# Patient Record
Sex: Female | Born: 1980 | Hispanic: Yes | Marital: Single | Smoking: Never smoker
Health system: Southern US, Community
[De-identification: ages and names within clinical notes are randomized; demographics above are authoritative.]

## PROBLEM LIST (undated history)

## (undated) DIAGNOSIS — K029 Dental caries, unspecified: Secondary | ICD-10-CM

## (undated) DIAGNOSIS — K649 Unspecified hemorrhoids: Secondary | ICD-10-CM

## (undated) DIAGNOSIS — Z789 Other specified health status: Secondary | ICD-10-CM

## (undated) HISTORY — DX: Dental caries, unspecified: K02.9

## (undated) HISTORY — PX: NO PAST SURGERIES: SHX2092

## (undated) HISTORY — DX: Unspecified hemorrhoids: K64.9

## (undated) HISTORY — DX: Other specified health status: Z78.9

---

## 2007-05-09 ENCOUNTER — Ambulatory Visit: Payer: Self-pay

## 2007-06-25 LAB — OB RESULTS CONSOLE RUBELLA ANTIBODY, IGM: Rubella: IMMUNE

## 2007-09-17 LAB — SICKLE CELL SCREEN: Sickle Cell Screen: NORMAL

## 2007-09-17 LAB — OB RESULTS CONSOLE VARICELLA ZOSTER ANTIBODY, IGG: Varicella: IMMUNE

## 2007-10-15 ENCOUNTER — Ambulatory Visit: Payer: Self-pay | Admitting: Family Medicine

## 2007-11-14 LAB — OB RESULTS CONSOLE TB SKIN TEST: CHL TB SkinTest: NEGATIVE

## 2007-12-31 ENCOUNTER — Inpatient Hospital Stay: Payer: Self-pay

## 2009-05-15 ENCOUNTER — Emergency Department: Payer: Self-pay | Admitting: Emergency Medicine

## 2009-11-11 ENCOUNTER — Inpatient Hospital Stay: Payer: Self-pay | Admitting: Obstetrics and Gynecology

## 2014-02-26 ENCOUNTER — Ambulatory Visit: Payer: Self-pay | Admitting: Physician Assistant

## 2014-03-12 ENCOUNTER — Ambulatory Visit: Payer: Self-pay | Admitting: Advanced Practice Midwife

## 2014-03-20 ENCOUNTER — Emergency Department: Payer: Self-pay | Admitting: Emergency Medicine

## 2014-03-20 LAB — CBC
HCT: 40.2 % (ref 35.0–47.0)
HGB: 13.6 g/dL (ref 12.0–16.0)
MCH: 31.5 pg (ref 26.0–34.0)
MCHC: 33.9 g/dL (ref 32.0–36.0)
MCV: 93 fL (ref 80–100)
PLATELETS: 201 10*3/uL (ref 150–440)
RBC: 4.33 10*6/uL (ref 3.80–5.20)
RDW: 13.1 % (ref 11.5–14.5)
WBC: 11.9 10*3/uL — AB (ref 3.6–11.0)

## 2014-03-20 LAB — WET PREP, GENITAL

## 2014-03-20 LAB — URINALYSIS, COMPLETE
Bilirubin,UR: NEGATIVE
Glucose,UR: NEGATIVE mg/dL (ref 0–75)
KETONE: NEGATIVE
Nitrite: NEGATIVE
PH: 7 (ref 4.5–8.0)
Protein: NEGATIVE
RBC,UR: 1421 /HPF (ref 0–5)
SPECIFIC GRAVITY: 1.017 (ref 1.003–1.030)
WBC UR: 7 /HPF (ref 0–5)

## 2014-03-20 LAB — HCG, QUANTITATIVE, PREGNANCY: BETA HCG, QUANT.: 6251 m[IU]/mL — AB

## 2014-03-20 LAB — GC/CHLAMYDIA PROBE AMP

## 2014-03-21 LAB — CBC WITH DIFFERENTIAL/PLATELET
Basophil #: 0 10*3/uL (ref 0.0–0.1)
Basophil %: 0.2 %
EOS PCT: 1 %
Eosinophil #: 0.2 10*3/uL (ref 0.0–0.7)
HCT: 42.5 % (ref 35.0–47.0)
HGB: 13.9 g/dL (ref 12.0–16.0)
LYMPHS PCT: 12.9 %
Lymphocyte #: 2.6 10*3/uL (ref 1.0–3.6)
MCH: 30.7 pg (ref 26.0–34.0)
MCHC: 32.6 g/dL (ref 32.0–36.0)
MCV: 94 fL (ref 80–100)
MONO ABS: 0.9 x10 3/mm (ref 0.2–0.9)
MONOS PCT: 4.2 %
Neutrophil #: 16.6 10*3/uL — ABNORMAL HIGH (ref 1.4–6.5)
Neutrophil %: 81.7 %
PLATELETS: 205 10*3/uL (ref 150–440)
RBC: 4.51 10*6/uL (ref 3.80–5.20)
RDW: 12.9 % (ref 11.5–14.5)
WBC: 20.3 10*3/uL — AB (ref 3.6–11.0)

## 2014-03-21 LAB — COMPREHENSIVE METABOLIC PANEL
ALBUMIN: 3.7 g/dL (ref 3.4–5.0)
ALK PHOS: 73 U/L
ALT: 26 U/L
ANION GAP: 4 — AB (ref 7–16)
BUN: 11 mg/dL (ref 7–18)
Bilirubin,Total: 0.2 mg/dL (ref 0.2–1.0)
CHLORIDE: 106 mmol/L (ref 98–107)
CO2: 26 mmol/L (ref 21–32)
Calcium, Total: 9.1 mg/dL (ref 8.5–10.1)
Creatinine: 0.74 mg/dL (ref 0.60–1.30)
EGFR (African American): >60
EGFR (Non-African Amer.): >60
GLUCOSE: 110 mg/dL — AB (ref 65–99)
Osmolality: 272 (ref 275–301)
Potassium: 3.4 mmol/L — ABNORMAL LOW (ref 3.5–5.1)
SGOT(AST): 19 U/L (ref 15–37)
Sodium: 136 mmol/L (ref 136–145)
Total Protein: 8.5 g/dL — ABNORMAL HIGH (ref 6.4–8.2)

## 2014-03-21 LAB — HCG, QUANTITATIVE, PREGNANCY: BETA HCG, QUANT.: 4732 m[IU]/mL — AB

## 2014-03-22 ENCOUNTER — Observation Stay: Payer: Self-pay | Admitting: Obstetrics and Gynecology

## 2014-03-22 LAB — URINALYSIS, COMPLETE
Bacteria: NONE SEEN
RBC,UR: 568 /HPF (ref 0–5)
SPECIFIC GRAVITY: 1.01 (ref 1.003–1.030)
Squamous Epithelial: NONE SEEN
WBC UR: 1 /HPF (ref 0–5)

## 2014-03-22 LAB — CBC WITH DIFFERENTIAL/PLATELET
Basophil #: 0 10*3/uL (ref 0.0–0.1)
Basophil %: 0.2 %
EOS PCT: 0.5 %
Eosinophil #: 0.1 10*3/uL (ref 0.0–0.7)
HCT: 29.6 % — ABNORMAL LOW (ref 35.0–47.0)
HGB: 10.1 g/dL — ABNORMAL LOW (ref 12.0–16.0)
LYMPHS ABS: 2.2 10*3/uL (ref 1.0–3.6)
LYMPHS PCT: 16.1 %
MCH: 31.5 pg (ref 26.0–34.0)
MCHC: 34 g/dL (ref 32.0–36.0)
MCV: 93 fL (ref 80–100)
Monocyte #: 0.6 x10 3/mm (ref 0.2–0.9)
Monocyte %: 4.1 %
Neutrophil #: 10.9 10*3/uL — ABNORMAL HIGH (ref 1.4–6.5)
Neutrophil %: 79.1 %
Platelet: 153 10*3/uL (ref 150–440)
RBC: 3.2 10*6/uL — AB (ref 3.80–5.20)
RDW: 12.7 % (ref 11.5–14.5)
WBC: 13.7 10*3/uL — AB (ref 3.6–11.0)

## 2014-03-23 LAB — PATHOLOGY REPORT

## 2014-08-20 NOTE — L&D Delivery Note (Signed)
VAGINAL DELIVERY NOTE:  Date of Delivery: 07/22/2015 Primary OB: [facility]  Gestational Age/EDD: 3442w0d 07/29/2015, by Ultrasound Antepartum complications: none Attending Physician: Hal NeerJones, CNM  Delivery Type: spontaneous vaginal delivery  Anesthesia: none Laceration: none Episiotomy: none Placenta: spontaneous Intrapartum complications: Variable decels prior to delivery  Due to CAN x 1 reduced Estimated Blood Loss: 400 ml GBS: neg Procedure Details: NSVD of viable female in straight OA pos, CAN x 1 loose reduced, Ant and post shoulder and body del at     . To mom;s infant with strong cry. CCx2 and cut per dad. Cord blood sent. SDOP intact with intact placenta. FF and lochia mod.   Baby: Liveborn female, Apgars 8-9, weight  #,  oz,    Natasha PimpleCaron [name] 07/22/2015, 11:00 PM

## 2014-12-11 NOTE — Op Note (Signed)
PATIENT NAME:  Natasha Mckee, Natasha Mckee MR#:  [id number] DATE OF BIRTH:  01/11/1981  DATE OF PROCEDURE:  03/22/2014  PREOPERATIVE DIAGNOSIS: Missed abortion.  POSTOPERATIVE DIAGNOSIS:  Missed abortion.  PROCEDURE PERFORMED: Suction curette dilation and curettage.   SURGEON: Elliot Gurneyarrie C. Tayjah Lobdell, MD  ESTIMATED BLOOD LOSS: 100 mL.   FINDINGS: Tissue most likely products of conception.   DESCRIPTION OF PROCEDURE: The patient was taken to the operating room and placed in supine position. After adequate general endotracheal anesthesia was instilled, the patient was prepped and draped in the usual sterile fashion. A side-opening speculum was placed in the patient's vagina. The anterior lip of the cervix was grasped with a single-tooth tenaculum. The uterus was sounded and dilated to accommodate a size 8 curved suction curette. Curettage was performed then suction was performed and then a final curette. Products of conception were identified. The uterus was massaged and found to be hemostatic. The patient was then laid supine and taken to recovery after having tolerated the procedure well and after the speculum was removed.   ____________________________ Elliot Gurneyarrie C. Mosie Angus, MD cck:ST D: 04/06/2014 23:43:14 ET T: 04/07/2014 00:21:37 ET JOB#: [id number]  cc: Elliot Gurneyarrie C. Leavy Heatherly, MD, <Dictator> Elliot GurneyARRIE C Sephora Boyar MD ELECTRONICALLY SIGNED 04/10/2014 17:39

## 2014-12-11 NOTE — Consult Note (Signed)
No Known Allergies:    General Aspect miscarriage   Present Illness 33yo G8 who presents with bleeding and an elevatted WBC, she was seen yesterday in the ER and was given cytotec and given an rtx for abx and cytotec. her wbc has gone up and the second dose was not taken. unsure if she took abx or not. she says she did but npt likely wityh the elevation of the WBC. she presnts today with cramping and bleeidng and a drop in beta from 6200 to 4700 now not bleeding. but cramoping severely   Case History and Physical Exam:  Chief Complaint Abdominal Pain  vaginal bleeding   Past Surgical History None   Primary Care Provider [facility]   Family History Non-Contributory   HEENT PERLA   Neck/Nodes Supple   Chest/Lungs Clear   Breasts Not examined   Cardiovascular Normal Sinus Rhythm   Abdomen Active bowel sounds  tendert o palpation over the uterus   Genitalia WNL   Rectal Not examined   Musculoskeletal Full range of motion   Neurological Grossly WNL   Skin Warm    Impression incomplete ab with elevation of WBC   Plan 1) start antibiotics and do d and c keep on iv abx until wbc normal clindamycin and ceftriaxone q 12 hours. 2) admit to obs   Electronic Signatures: Adria DevonKlett, Dot Splinter (MD)  (Signed 03-Aug-15 00:35)  Authored: Allergies, General Aspect/Present Illness, History and Physical Exam, Impression/Plan   Last Updated: 03-Aug-15 00:35 by Adria DevonKlett, Shantanu Strauch (MD)

## 2015-07-22 ENCOUNTER — Inpatient Hospital Stay
Admission: EM | Admit: 2015-07-22 | Discharge: 2015-07-24 | DRG: 775 | Disposition: A | Discharge status: Home / Self Care | Payer: Medicaid Other | Attending: Obstetrics and Gynecology | Admitting: Obstetrics and Gynecology

## 2015-07-22 DIAGNOSIS — O99214 Obesity complicating childbirth: Principal | ICD-10-CM | POA: Diagnosis present

## 2015-07-22 DIAGNOSIS — K029 Dental caries, unspecified: Secondary | ICD-10-CM | POA: Diagnosis present

## 2015-07-22 DIAGNOSIS — Z3A39 39 weeks gestation of pregnancy: Secondary | ICD-10-CM | POA: Diagnosis not present

## 2015-07-22 LAB — CBC WITH DIFFERENTIAL/PLATELET
BASOS PCT: 0 %
Basophils Absolute: 0 10*3/uL (ref 0–0.1)
EOS ABS: 0.1 10*3/uL (ref 0–0.7)
EOS PCT: 1 %
HCT: 39.6 % (ref 35.0–47.0)
Hemoglobin: 13.2 g/dL (ref 12.0–16.0)
LYMPHS ABS: 2.1 10*3/uL (ref 1.0–3.6)
Lymphocytes Relative: 23 %
MCH: 30.9 pg (ref 26.0–34.0)
MCHC: 33.4 g/dL (ref 32.0–36.0)
MCV: 92.5 fL (ref 80.0–100.0)
Monocytes Absolute: 0.6 10*3/uL (ref 0.2–0.9)
Monocytes Relative: 7 %
Neutro Abs: 6.6 10*3/uL — ABNORMAL HIGH (ref 1.4–6.5)
Neutrophils Relative %: 69 %
PLATELETS: 210 10*3/uL (ref 150–440)
RBC: 4.28 MIL/uL (ref 3.80–5.20)
RDW: 13.5 % (ref 11.5–14.5)
WBC: 9.5 10*3/uL (ref 3.6–11.0)

## 2015-07-22 LAB — ABO/RH: ABO/RH(D): B POS

## 2015-07-22 LAB — TYPE AND SCREEN
ABO/RH(D): B POS
ANTIBODY SCREEN: NEGATIVE

## 2015-07-22 MED ORDER — LACTATED RINGERS IV SOLN
INTRAVENOUS | Status: DC
  Administered 2015-07-22: 19:00:00 via INTRAVENOUS

## 2015-07-22 MED ORDER — ACETAMINOPHEN 325 MG PO TABS
650.0000 mg | ORAL_TABLET | ORAL | Status: DC | PRN
  Administered 2015-07-23: 650 mg via ORAL
  Filled 2015-07-22: qty 2

## 2015-07-22 MED ORDER — ONDANSETRON HCL 4 MG/2ML IJ SOLN: 4.0000 mg | Freq: Four times a day (QID) | INTRAMUSCULAR | Status: DC | PRN

## 2015-07-22 MED ORDER — OXYTOCIN 40 UNITS IN LACTATED RINGERS INFUSION - SIMPLE MED
62.5000 mL/h | INTRAVENOUS | Status: DC
  Filled 2015-07-22: qty 1000

## 2015-07-22 MED ORDER — BUTORPHANOL TARTRATE 1 MG/ML IJ SOLN
1.0000 mg | INTRAMUSCULAR | Status: DC | PRN
  Administered 2015-07-22: 1 mg via INTRAVENOUS

## 2015-07-22 MED ORDER — OXYTOCIN BOLUS FROM INFUSION
500.0000 mL | INTRAVENOUS | Status: DC
  Administered 2015-07-22: 500 mL via INTRAVENOUS

## 2015-07-22 MED ORDER — LIDOCAINE HCL (PF) 1 % IJ SOLN
30.0000 mL | INTRAMUSCULAR | Status: DC | PRN
  Filled 2015-07-22: qty 30

## 2015-07-22 MED ORDER — BUTORPHANOL TARTRATE 1 MG/ML IJ SOLN
1.0000 mg | INTRAMUSCULAR | Status: DC | PRN
  Filled 2015-07-22: qty 1

## 2015-07-22 MED ORDER — CITRIC ACID-SODIUM CITRATE 334-500 MG/5ML PO SOLN: 30.0000 mL | ORAL | Status: DC | PRN

## 2015-07-22 MED ORDER — LACTATED RINGERS IV SOLN
500.0000 mL | INTRAVENOUS | Status: DC | PRN
Start: 2015-07-22 — End: 2015-07-23

## 2015-07-22 NOTE — Progress Notes (Signed)
S: Very uncomfortable without epidural. + CTX, + LOF, no VB O: Filed Vitals:   07/22/15 1815 07/22/15 1818  BP:  136/85  Pulse:  92  Temp:  99.3 F (37.4 C)  TempSrc:  Oral  Resp:  18  Height: 5\' 8"  (1.727 m)   Weight: 182 lb (82.555 kg)      Gen: NAD, AAOx3      Abd: FNTTP      Ext: Non-tender, Nonedmeatous    FHT:+mod var + accelerations, occas variable decelerations TOCO: Q 2-2.5 min SVE: 4/100/vtx-2   A/P:  34 y.o. yo [id number] at 7037w0d for SROM for clear  Fluid this pm   Labor: progressing  FWB: Reassuring Cat 1 tracing. EFW 7#15 oz  GBS:neg Waiting for CBC to do epidural.    Sharee Pimplearon W Joeleen Wortley 9:50 PM

## 2015-07-22 NOTE — H&P (Signed)
HISTORY AND PHYSICAL  HISTORY OF PRESENT ILLNESS: Natasha Mckee is a 34 y.o. [id number] at 256w0d by LMP consistent with  week ultrasound with a pregnancy complicated by  presenting for induction of labor. PNC significant for grand multiparity, AMA, 1 prev Pre-term at 5136 6/7 weeks, dental caries, Obesity. 1 h GCT abnormal with normal 3 h GCT.   She has  been having  leakage of fluid since 4pm today,  No vaginal bleeding, or decreased fetal movement.    REVIEW OF SYSTEMS: A complete review of systems was performed and was specifically negative for headache, changes in vision, RUQ pain, shortness of breath, chest pain, lower extremity edema and dysuria.   HISTORY:  History reviewed. No pertinent past medical history.  Past Surgical History  Procedure Laterality Date  . No past surgeries      No current facility-administered medications on file prior to encounter.   No current outpatient prescriptions on file prior to encounter.     No Known Allergies  OB History  Gravida Para Term Preterm AB SAB TAB Ectopic Multiple Living  9 6 5 1 2 2    6     # Outcome Date GA Lbr Len/2nd Weight Sex Delivery Anes PTL Lv  9 Current           8 SAB           7 SAB           6 Preterm           5 Term           4 Term           3 Term           2 Term           1 Term               Gynecologic History: GC/CH neg History of Abnormal Pap Smear: None found on chart History of STI: unknown   Social History  Substance Use Topics  . Smoking status: Never Smoker   . Smokeless tobacco: Never Used  . Alcohol Use: No    PHYSICAL EXAM: Temp:  [99.3 F (37.4 C)] 99.3 F (37.4 C) (12/02 1818) Pulse Rate:  [92] 92 (12/02 1818) Resp:  [18] 18 (12/02 1818) BP: (136)/(85) 136/85 mmHg (12/02 1818) Weight:  [182 lb (82.555 kg)] 182 lb (82.555 kg) (12/02 1815)  GENERAL: NAD AAOx3 CHEST:CTAB no increased work of breathing CV:RRR no appreciable murmurs, rubs, gallops ABDOMEN: gravid, nontender,  EFW 7#14 by [place] EXTREMITIES:  Warm and well-perfused, nontender, nonedematous, 1+ DTRs 0clonus CERVIX: 3/80/vtx SPECULUM: not done  FHT:s baseline with  Mod variability ,+ accelerations and  occas variable deceleration.   Toco: q 3-5 mins  DIAGNOSTIC STUDIES: None in pt chart  PRENATAL STUDIES:  Prenatal Labs:  MBT: B pos; Rubella immune, Varicella immune, HIV neg, RPR neg, Hep B neg, GC/CT neg, GBS neg , glucola  3 h WNL  [facility], normal anatomy, no % listed for growth  ASSESSMENT AND PLAN:  1. Fetal Well being  - Fetal Tracing: Cat I - Ultrasound:  reviewed, as above - Group B Streptococcus: neg - Presentation: Vtx  confirmed by CNM  2. Routine OB: - Prenatal labs reviewed, as above - Rh  B pos   3. Induction of Labor:  -  Contractions external toco in place -  Pelvis proven to ? (pt unsure of  biggest weight  -  Plan for delivery: Stadol and perhaps Epidural  4. Post Partum Planning: - Infant feeding: Bottle - Contraception: unsure

## 2015-07-23 LAB — CBC
HCT: 35.8 % (ref 35.0–47.0)
HEMOGLOBIN: 12.2 g/dL (ref 12.0–16.0)
MCH: 31.1 pg (ref 26.0–34.0)
MCHC: 34.1 g/dL (ref 32.0–36.0)
MCV: 91.2 fL (ref 80.0–100.0)
Platelets: 196 10*3/uL (ref 150–440)
RBC: 3.93 MIL/uL (ref 3.80–5.20)
RDW: 13.4 % (ref 11.5–14.5)
WBC: 18.3 10*3/uL — ABNORMAL HIGH (ref 3.6–11.0)

## 2015-07-23 MED ORDER — MEASLES, MUMPS & RUBELLA VAC ~~LOC~~ INJ: 0.5000 mL | INJECTION | Freq: Once | SUBCUTANEOUS | Status: DC

## 2015-07-23 MED ORDER — SODIUM CHLORIDE 0.9 % IJ SOLN: 3.0000 mL | Freq: Two times a day (BID) | INTRAMUSCULAR | Status: DC

## 2015-07-23 MED ORDER — FLEET ENEMA 7-19 GM/118ML RE ENEM: 1.0000 | ENEMA | Freq: Every day | RECTAL | Status: DC | PRN

## 2015-07-23 MED ORDER — TETANUS-DIPHTH-ACELL PERTUSSIS 5-2.5-18.5 LF-MCG/0.5 IM SUSP: 0.5000 mL | Freq: Once | INTRAMUSCULAR | Status: DC

## 2015-07-23 MED ORDER — SIMETHICONE 80 MG PO CHEW: 80.0000 mg | CHEWABLE_TABLET | ORAL | Status: DC | PRN

## 2015-07-23 MED ORDER — SENNOSIDES-DOCUSATE SODIUM 8.6-50 MG PO TABS: 2.0000 | ORAL_TABLET | ORAL | Status: DC

## 2015-07-23 MED ORDER — ZOLPIDEM TARTRATE 5 MG PO TABS: 5.0000 mg | ORAL_TABLET | Freq: Every evening | ORAL | Status: DC | PRN

## 2015-07-23 MED ORDER — OXYCODONE-ACETAMINOPHEN 5-325 MG PO TABS: 1.0000 | ORAL_TABLET | ORAL | Status: DC | PRN

## 2015-07-23 MED ORDER — ONDANSETRON HCL 4 MG/2ML IJ SOLN: 4.0000 mg | INTRAMUSCULAR | Status: DC | PRN

## 2015-07-23 MED ORDER — OXYCODONE-ACETAMINOPHEN 5-325 MG PO TABS: 2.0000 | ORAL_TABLET | ORAL | Status: DC | PRN

## 2015-07-23 MED ORDER — ONDANSETRON HCL 4 MG PO TABS: 4.0000 mg | ORAL_TABLET | ORAL | Status: DC | PRN

## 2015-07-23 MED ORDER — BISACODYL 10 MG RE SUPP: 10.0000 mg | Freq: Every day | RECTAL | Status: DC | PRN

## 2015-07-23 MED ORDER — IBUPROFEN 600 MG PO TABS
600.0000 mg | ORAL_TABLET | Freq: Four times a day (QID) | ORAL | Status: DC
  Administered 2015-07-23 – 2015-07-24 (×4): 600 mg via ORAL
  Filled 2015-07-23 (×4): qty 1

## 2015-07-23 MED ORDER — DIPHENHYDRAMINE HCL 25 MG PO CAPS: 25.0000 mg | ORAL_CAPSULE | Freq: Four times a day (QID) | ORAL | Status: DC | PRN

## 2015-07-23 MED ORDER — OXYTOCIN 40 UNITS IN LACTATED RINGERS INFUSION - SIMPLE MED
62.5000 mL/h | INTRAVENOUS | Status: DC | PRN
  Filled 2015-07-23: qty 1000

## 2015-07-23 MED ORDER — SODIUM CHLORIDE 0.9 % IJ SOLN: 3.0000 mL | INTRAMUSCULAR | Status: DC | PRN

## 2015-07-23 MED ORDER — WITCH HAZEL-GLYCERIN EX PADS: 1.0000 "application " | MEDICATED_PAD | CUTANEOUS | Status: DC | PRN

## 2015-07-23 MED ORDER — PRENATAL MULTIVITAMIN CH
1.0000 | ORAL_TABLET | Freq: Every day | ORAL | Status: DC
  Administered 2015-07-23 – 2015-07-24 (×2): 1 via ORAL
  Filled 2015-07-23 (×2): qty 1

## 2015-07-23 MED ORDER — SODIUM CHLORIDE 0.9 % IV SOLN: 250.0000 mL | INTRAVENOUS | Status: DC | PRN

## 2015-07-23 MED ORDER — BENZOCAINE-MENTHOL 20-0.5 % EX AERO
1.0000 "application " | INHALATION_SPRAY | CUTANEOUS | Status: DC | PRN
  Administered 2015-07-23: 1 via TOPICAL
  Filled 2015-07-23: qty 56

## 2015-07-23 MED ORDER — ACETAMINOPHEN 325 MG PO TABS: 650.0000 mg | ORAL_TABLET | ORAL | Status: DC | PRN

## 2015-07-23 MED ORDER — LANOLIN HYDROUS EX OINT: TOPICAL_OINTMENT | CUTANEOUS | Status: DC | PRN

## 2015-07-23 MED ORDER — DIBUCAINE 1 % RE OINT: 1.0000 "application " | TOPICAL_OINTMENT | RECTAL | Status: DC | PRN

## 2015-07-23 NOTE — Progress Notes (Signed)
Post Partum Day 1 Subjective: no complaints and up ad lib  Objective: Blood pressure 93/46, pulse 75, temperature 98.8 F (37.1 C), temperature source Oral, resp. rate 20, height 5\' 8"  (1.727 m), weight 182 lb (82.555 kg), last menstrual period 10/09/2014, SpO2 99 %.  Physical Exam:  General: alert and cooperative Lochia: appropriate Uterine Fundus: firm Perineum intact DVT Evaluation: No evidence of DVT seen on physical exam.   Recent Labs  07/22/15 1922 07/23/15 0628  HGB 13.2 12.2  HCT 39.6 35.8  WBC 9.5 18.3*  PLT 210 196    Assessment/Plan: Plan for discharge tomorrow  Will discuss contraception in am with Interpreter   LOS: 1 day   Sharee Pimplearon W Tara Wich 07/23/2015, 10:07 AM

## 2015-07-24 LAB — RPR: RPR: NONREACTIVE

## 2015-07-24 NOTE — Final Progress Note (Signed)
Discharge instructions complete. Interpreter used and patient verbalizes understanding. Patient discharged home at 1530

## 2015-07-24 NOTE — Discharge Instructions (Signed)
Care After Vaginal Delivery °Congratulations on your new baby!! ° °Refer to this sheet in the next few weeks. These discharge instructions provide you with information on caring for yourself after delivery. Your caregiver may also give you specific instructions. Your treatment has been planned according to the most current medical practices available, but problems sometimes occur. Call your caregiver if you have any problems or questions after you go home. ° °HOME CARE INSTRUCTIONS °· Take over-the-counter or prescription medicines only as directed by your caregiver or pharmacist. °· Do not drink alcohol, especially if you are breastfeeding or taking medicine to relieve pain. °· Do not chew or smoke tobacco. °· Do not use illegal drugs. °· Continue to use good perineal care. Good perineal care includes: °¨ Wiping your perineum from front to back. °¨ Keeping your perineum clean. °· Do not use tampons or douche until your caregiver says it is okay. °· Shower, wash your hair, and take tub baths as directed by your caregiver. °· Wear a well-fitting bra that provides breast support. °· Eat healthy foods. °· Drink enough fluids to keep your urine clear or pale yellow. °· Eat high-fiber foods such as whole grain cereals and breads, brown rice, beans, and fresh fruits and vegetables every day. These foods may help prevent or relieve constipation. °· Follow your caregiver's recommendations regarding resumption of activities such as climbing stairs, driving, lifting, exercising, or traveling. Specifically, no driving for two weeks, so that you are comfortable reacting quickly in an emergency. °· Talk to your caregiver about resuming sexual activities. Resumption of sexual activities is dependent upon your risk of infection, your rate of healing, and your comfort and desire to resume sexual activity. Usually we recommend waiting about six weeks, or until your bleeding stops and you are interested in sex. °· Try to have someone  help you with your household activities and your newborn for at least a few days after you leave the hospital. Even longer is better. °· Rest as much as possible. Try to rest or take a nap when your newborn is sleeping. Sleep deprivation can be very hard after delivery. °· Increase your activities gradually. °· Keep all of your scheduled postpartum appointments. It is very important to keep your scheduled follow-up appointments. At these appointments, your caregiver will be checking to make sure that you are healing physically and emotionally. ° °SEEK MEDICAL CARE IF:  °· You are passing large clots from your vagina.  °· You have a foul smelling discharge from your vagina. °· You have trouble urinating. °· You are urinating frequently. °· You have pain when you urinate. °· You have a change in your bowel movements. °· You have increasing redness, pain, or swelling near your vaginal incision (episiotomy) or vaginal tear. °· You have pus draining from your episiotomy or vaginal tear. °· Your episiotomy or vaginal tear is separating. °· You have painful, hard, or reddened breasts. °· You have a severe headache. °· You have blurred vision or see spots. °· You feel sad or depressed. °· You have thoughts of hurting yourself or your newborn. °· You have questions about your care, the care of your newborn, or medicines. °· You are dizzy or light-headed. °· You have a rash. °· You have nausea or vomiting. °· You were breastfeeding and have not had a menstrual period within 12 weeks after you stopped breastfeeding. °· You are not breastfeeding and have not had a menstrual period by the 12th week after delivery. °· You   have a fever. ° °SEEK IMMEDIATE MEDICAL CARE IF:  °· You have persistent pain. °· You have chest pain. °· You have shortness of breath. °· You faint. °· You have leg pain. °· You have stomach pain. °· Your vaginal bleeding saturates two or more sanitary pads in 1 hour. ° °MAKE SURE YOU:  °· Understand these  instructions. °· Will get help right away if you are not doing well or get worse. °·  °Document Released: 08/03/2000 Document Revised: 12/21/2013 Document Reviewed: 04/02/2012 ° °ExitCare® Patient Information ©2015 [employer]. This information is not intended to replace advice given to you by your health care provider. Make sure you discuss any questions you have with your health care provider. ° °

## 2015-07-24 NOTE — Discharge Summary (Signed)
Obstetric Discharge Summary   Patient ID: Andree ElkRosa Maria Garcia Perez MRN: [id number] DOB/AGE: [birth year] 36 y.o.   Date of Admission: 07/22/2015  Date of Discharge: 07/24/15  Admitting Diagnosis: IUP at 722w0d  Secondary Diagnosis: IOL   Mode of Delivery: normal spontaneous vaginal delivery     Discharge Diagnosis:NSVD   Intrapartum Procedures: None   Post partum procedures:none  Complications: none   Brief Hospital Course  [facility] Pen[facility] is a [id number] who had a SVD on 07/22/15;  for further details of this delivery, please refer to the delivery note.  Patient had an uncomplicated postpartum course.  By time of discharge on PPD#2, her pain was controlled on oral pain medications; she had appropriate lochia and was ambulating, voiding without difficulty and tolerating regular diet.  She was deemed stable for discharge to home.      Labs: CBC Latest Ref Rng 07/23/2015 07/22/2015 03/22/2014  WBC 3.6 - 11.0 K/uL 18.3(H) 9.5 13.7(H)  Hemoglobin 12.0 - 16.0 g/dL 14.712.2 82.913.2 10.1(L)  Hematocrit 35.0 - 47.0 % 35.8 39.6 29.6(L)  Platelets 150 - 440 K/uL 196 210 153   B POS  Physical exam:  Blood pressure 96/66, pulse 74, temperature 97.7 F (36.5 C), temperature source Oral, resp. rate 18, height 5\' 8"  (1.727 m), weight 182 lb (82.555 kg), last menstrual period 10/09/2014, SpO2 100 %, unknown if currently breastfeeding. General: alert and no distress Cardiac: S1S2, RRR, no M/R/G. Lungs: CTA bilat, no W/R/R. Lochia: appropriate Abdomen: soft, NT Uterine Fundus: firm Extremities: No evidence of DVT seen on physical exam. No lower extremity edema.  Discharge Instructions: Per After Visit Summary. Activity: Advance as tolerated. Pelvic rest for 6 weeks.  Also refer to After Visit Summary Diet: Regular Medications:   Medication List    ASK your doctor about these medications        prenatal multivitamin Tabs tablet  Take 1 tablet by mouth daily at 12 noon.        Outpatient follow up:      Follow-up Information    Follow up In 6 weeks.     Postpartum contraception: Plans Depo  Discharged Condition: Stable  Discharged to: Home   Newborn Data:  Baby Boy   named "Donald PoreJorge"  Disposition home  Apgars: APGAR (1 MIN): 8   APGAR (5 MINS): 9   APGAR (10 MINS):    Baby Feeding:Both  Sharee Pimplearon W Keldric Poyer, CNM 07/24/2015

## 2019-01-29 ENCOUNTER — Other Ambulatory Visit: Payer: Self-pay

## 2019-01-29 ENCOUNTER — Telehealth: Payer: Self-pay | Admitting: *Deleted

## 2019-01-29 DIAGNOSIS — Z20822 Contact with and (suspected) exposure to covid-19: Secondary | ICD-10-CM

## 2019-01-29 NOTE — Telephone Encounter (Signed)
[place] called to schedule this patient for the covid-19 test.  Pt notified using the [facility] Interpre[employer] [place] to schedule for today at [facility] in [place] at 3:30. Pt voiced understanding.

## 2019-02-02 LAB — NOVEL CORONAVIRUS, NAA
SARS-CoV-2, NAA: POSITIVE
SARS-CoV-2, NAA: DETECTED — AB

## 2019-02-03 ENCOUNTER — Encounter: Payer: Self-pay | Admitting: *Deleted

## 2019-11-18 ENCOUNTER — Other Ambulatory Visit: Payer: Self-pay

## 2019-11-18 ENCOUNTER — Ambulatory Visit: Payer: Self-pay | Attending: Oncology | Admitting: *Deleted

## 2019-11-18 ENCOUNTER — Other Ambulatory Visit: Payer: Self-pay | Admitting: *Deleted

## 2019-11-18 ENCOUNTER — Encounter: Payer: Self-pay | Admitting: *Deleted

## 2019-11-18 ENCOUNTER — Ambulatory Visit
Admission: RE | Admit: 2019-11-18 | Discharge: 2019-11-18 | Disposition: A | Discharge status: Home / Self Care | Payer: Self-pay | Source: Ambulatory Visit | Attending: Oncology | Admitting: Oncology

## 2019-11-18 VITALS — BP 130/88 | HR 90 | Temp 98.2°F | Ht 64.0 in | Wt 167.0 lb

## 2019-11-18 DIAGNOSIS — N6489 Other specified disorders of breast: Secondary | ICD-10-CM

## 2019-11-18 DIAGNOSIS — Z0000 Encounter for general adult medical examination without abnormal findings: Secondary | ICD-10-CM | POA: Insufficient documentation

## 2019-11-18 NOTE — Patient Instructions (Signed)
Gave patient hand-out, Women Staying Healthy, Active and Well from [facility], with education on breast health, pap smears, heart and colon health. 

## 2019-11-18 NOTE — Progress Notes (Signed)
  Subjective:     Patient ID: Natasha Mckee, female   DOB: 1981-07-30, 39 y.o.   MRN: [id number]  HPI   Review of Systems     Objective:   Physical Exam Chest:     Breasts:        Right: No swelling, bleeding, inverted nipple, mass, nipple discharge, skin change or tenderness.        Left: No swelling, bleeding, inverted nipple, mass, nipple discharge, skin change or tenderness.  Lymphadenopathy:     Upper Body:     Right upper body: No supraclavicular or axillary adenopathy.     Left upper body: No supraclavicular or axillary adenopathy.        Assessment:     39 year old Hispanic female presents to [facility] with complaints of inflammation under the right axilla.  Natasha Mckee, the interpreter present during the interview and exam.  Patient states she has noticed inflammation over the last couple of months in the axilla.  On clinical breast exam there in no dominant mass, skin changes, nipple discharge or lymphadenopathy.  There is no redness or edema in the axilla.  Taught self breast awareness.  Reviewed anatomy with patient and discussed breast axillary tissue.  Patient verbalizes better understanding of what she is seeing and her concerns.  Last pap on 10/23/19 was negative without HPV co-testing.  Next pap due in 3 years.   Risk Assessment    Risk Scores      11/18/2019   Last edited by: Scarlett Presto, RN   5-year risk: 0.2 %   Lifetime risk: 4.7 %            Plan:     Screening mammogram ordered.  Will follow up per [facility] protocol.

## 2019-12-11 ENCOUNTER — Ambulatory Visit
Admission: RE | Admit: 2019-12-11 | Discharge: 2019-12-11 | Disposition: A | Discharge status: Home / Self Care | Payer: Self-pay | Source: Ambulatory Visit | Attending: Oncology | Admitting: Oncology

## 2019-12-11 DIAGNOSIS — N6489 Other specified disorders of breast: Secondary | ICD-10-CM | POA: Insufficient documentation

## 2019-12-24 ENCOUNTER — Encounter: Payer: Self-pay | Admitting: *Deleted

## 2019-12-24 ENCOUNTER — Other Ambulatory Visit: Payer: Self-pay | Admitting: *Deleted

## 2019-12-24 DIAGNOSIS — N6489 Other specified disorders of breast: Secondary | ICD-10-CM

## 2019-12-24 NOTE — Progress Notes (Signed)
Letter mailed to inform patient of her next mammogram appointment on 06/09/20 @ 2:00.  Will follow up per [facility] protocol.

## 2020-06-09 ENCOUNTER — Inpatient Hospital Stay: Admission: RE | Admit: 2020-06-09 | Payer: Self-pay | Source: Ambulatory Visit

## 2020-06-09 ENCOUNTER — Other Ambulatory Visit: Payer: Self-pay

## 2020-08-20 NOTE — L&D Delivery Note (Signed)
Delivery Note  Malaka Ruffner is a [id number] at [redacted]w[redacted]d with an LMP of 09/03/20, consistent with [place] at [redacted]w[redacted]d.   First Stage: Labor onset: 2109 Induction: oxytocin and cervical balloon Analgesia /Anesthesia intrapartum: epidural SROM at 2058  Second Stage: Complete dilation at 0025 Onset of pushing at 0032 FHR second stage Category 2, intermittent variables pushing, baseline FHR 140  Delivery of a viable baby girl on 06/06/2021  at 0048 by CNM/ Dr Feliberto Gottron present Delivery of fetal head in OA position with restitution to LOT. x1 nuchal cord reduced;  Anterior then posterior shoulders delivered easily with gentle downward traction. Baby placed on mom's chest, and attended to by baby RN Cord double clamped after cessation of pulsation, cut by FOB  Cord blood sample collection: Not Indicated B POS Collection of cord blood donation N/A Arterial cord blood sample N/A  Third Stage: Oxytocin bolus started after delivery of infant for hemorrhage prophylaxis  Placenta delivered manually intact with 3 VC @ 06/06/2021  Placenta disposition: discarded Uterine tone firm / bleeding small  No  laceration identified  Anesthesia for repair: N/A Repair N/A Est. Blood Loss (mL): 200  Complications: None  Mom to postpartum.  Baby to Couplet care / Skin to Skin.  Newborn: Information for the patient's newborn:  Imogen, Maddalena [[id number]]  Live born female  Birth Weight:   APGAR: ,   Newborn Delivery   Birth date/time: 06/06/2021 00:48:00 Delivery type: Vaginal, Spontaneous        Feeding planned: Breast  ---------- Chari Manning CNM Certified Nurse Midwife [facility]

## 2020-11-07 ENCOUNTER — Ambulatory Visit (LOCAL_COMMUNITY_HEALTH_CENTER): Payer: Self-pay

## 2020-11-07 ENCOUNTER — Other Ambulatory Visit: Payer: Self-pay

## 2020-11-07 VITALS — BP 118/82 | Ht 64.0 in | Wt 174.5 lb

## 2020-11-07 DIAGNOSIS — Z3201 Encounter for pregnancy test, result positive: Secondary | ICD-10-CM

## 2020-11-07 LAB — PREGNANCY, URINE: Preg Test, Ur: POSITIVE — AB

## 2020-11-07 MED ORDER — PRENATAL 27-0.8 MG PO TABS: 1.0000 | ORAL_TABLET | Freq: Every day | ORAL | 0 refills | Status: AC

## 2020-11-07 NOTE — Progress Notes (Signed)
UPT positive. Plans prenatal care at [facility]. To clerk for preadmit. Lang line, interpreter. Jerel Shepherd, RN

## 2020-11-22 ENCOUNTER — Ambulatory Visit: Payer: Medicaid Other | Admitting: Advanced Practice Midwife

## 2020-11-22 ENCOUNTER — Other Ambulatory Visit: Payer: Self-pay

## 2020-11-22 VITALS — BP 117/72 | HR 85 | Temp 98.4°F | Wt 177.0 lb

## 2020-11-22 DIAGNOSIS — O09521 Supervision of elderly multigravida, first trimester: Secondary | ICD-10-CM | POA: Diagnosis not present

## 2020-11-22 DIAGNOSIS — Z641 Problems related to multiparity: Secondary | ICD-10-CM

## 2020-11-22 DIAGNOSIS — O0990 Supervision of high risk pregnancy, unspecified, unspecified trimester: Secondary | ICD-10-CM | POA: Insufficient documentation

## 2020-11-22 DIAGNOSIS — Z91410 Personal history of adult physical and sexual abuse: Secondary | ICD-10-CM

## 2020-11-22 DIAGNOSIS — U071 COVID-19: Secondary | ICD-10-CM

## 2020-11-22 DIAGNOSIS — O99210 Obesity complicating pregnancy, unspecified trimester: Secondary | ICD-10-CM | POA: Insufficient documentation

## 2020-11-22 DIAGNOSIS — O09529 Supervision of elderly multigravida, unspecified trimester: Secondary | ICD-10-CM | POA: Insufficient documentation

## 2020-11-22 DIAGNOSIS — Z8751 Personal history of pre-term labor: Secondary | ICD-10-CM

## 2020-11-22 LAB — URINALYSIS
Bilirubin, UA: NEGATIVE
Glucose, UA: NEGATIVE
Ketones, UA: NEGATIVE
Nitrite, UA: NEGATIVE
Protein,UA: NEGATIVE
Specific Gravity, UA: 1.02 (ref 1.005–1.030)
Urobilinogen, Ur: 0.2 mg/dL (ref 0.2–1.0)
pH, UA: 7 (ref 5.0–7.5)

## 2020-11-22 LAB — WET PREP FOR TRICH, YEAST, CLUE
Trichomonas Exam: NEGATIVE
Yeast Exam: NEGATIVE

## 2020-11-22 LAB — HEMOGLOBIN, FINGERSTICK: Hemoglobin: 14 g/dL (ref 11.1–15.9)
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
## 2020-11-22 NOTE — Progress Notes (Signed)
Hgb, Wet [place] and UA results reviewed. Per standing orders no treatment indicated. Tawny Hopping, RN

## 2020-11-22 NOTE — Progress Notes (Addendum)
Here today for 11.6 week MH IP. Taking PNV QD. Denies ED/hospital visits since +PT. Denies International travel since having negative PPD in 2006. Declines Flu vaccine today. Tawny Hopping, RN

## 2020-11-23 ENCOUNTER — Telehealth: Payer: Self-pay

## 2020-11-23 LAB — CBC/D/PLT+RPR+RH+ABO+AB SCR
Antibody Screen: NEGATIVE
Basophils Absolute: 0.1 10*3/uL (ref 0.0–0.2)
Basos: 1 %
EOS (ABSOLUTE): 0.2 10*3/uL (ref 0.0–0.4)
Eos: 1 %
Hematocrit: 42.1 % (ref 34.0–46.6)
Hemoglobin: 14.1 g/dL (ref 11.1–15.9)
Hepatitis B Surface Ag: NEGATIVE
Immature Grans (Abs): 0 10*3/uL (ref 0.0–0.1)
Immature Granulocytes: 0 %
Lymphocytes Absolute: 2.5 10*3/uL (ref 0.7–3.1)
Lymphs: 19 %
MCH: 30.9 pg (ref 26.6–33.0)
MCHC: 33.5 g/dL (ref 31.5–35.7)
MCV: 92 fL (ref 79–97)
Monocytes Absolute: 0.8 10*3/uL (ref 0.1–0.9)
Monocytes: 6 %
Neutrophils Absolute: 9.3 10*3/uL — ABNORMAL HIGH (ref 1.4–7.0)
Neutrophils: 73 %
Platelets: 242 10*3/uL (ref 150–450)
RBC: 4.57 x10E6/uL (ref 3.77–5.28)
RDW: 12.8 % (ref 11.7–15.4)
RPR Ser Ql: NONREACTIVE
Rh Factor: POSITIVE
WBC: 12.9 10*3/uL — ABNORMAL HIGH (ref 3.4–10.8)

## 2020-11-23 LAB — 789231 7+OXYCODONE-BUND
Amphetamines, Urine: NEGATIVE ng/mL
BENZODIAZ UR QL: NEGATIVE ng/mL
Barbiturate screen, urine: NEGATIVE ng/mL
Cannabinoid Quant, Ur: NEGATIVE ng/mL
Cocaine (Metab.): NEGATIVE ng/mL
OPIATE SCREEN URINE: NEGATIVE ng/mL
Oxycodone/Oxymorphone, Urine: NEGATIVE ng/mL
PCP Quant, Ur: NEGATIVE ng/mL

## 2020-11-23 LAB — HCV AB W REFLEX TO QUANT PCR: HCV Ab: <0.1 s/co ratio (ref 0.0–0.9)

## 2020-11-23 LAB — HCV INTERPRETATION

## 2020-11-23 LAB — LEAD, BLOOD (ADULT >= 16 YRS): Lead-Whole Blood: <1 ug/dL (ref 0–4)

## 2020-11-23 LAB — HIV-1/HIV-2 QUALITATIVE RNA
HIV-1 RNA, Qualitative: NONREACTIVE
HIV-2 RNA, Qualitative: NONREACTIVE

## 2020-11-23 LAB — HGB A1C W/O EAG: Hgb A1c MFr Bld: 5.7 % — ABNORMAL HIGH (ref 4.8–5.6)

## 2020-11-23 NOTE — Telephone Encounter (Signed)
Call to [facility] MFM scheduler as client needs [place] (;less than 14 weeks and genetic counseling). Per Hazle Coca CNM notes, client declined first screen. Per Britta Mccreedy, as [place] is less than 14 weeks and first screen not ordered, she is unable to schedule appt and RN needs to talk with [facility] MFM when open tomorrow (Thursday). ESherren Kerns CNM notified of above and S > D with high risk added to referral. Referral re-faxed with confirmation received. Jossie Ng, RN

## 2020-11-23 NOTE — Telephone Encounter (Signed)
Call to client with [facility] MFM appt on 01/05/2021 0900 (genetic counseling) and 1000 ([place]). Client verbalized understanding of above. Roddie Mc Yemen interpreted during call. Jossie Ng, RN

## 2020-11-24 LAB — IGP, APTIMA HPV
HPV Aptima: NEGATIVE
PAP Smear Comment: 0

## 2020-11-24 LAB — CHLAMYDIA/GC NAA, CONFIRMATION
Chlamydia trachomatis, NAA: NEGATIVE
Neisseria gonorrhoeae, NAA: NEGATIVE

## 2020-11-25 LAB — URINE CULTURE

## 2020-12-06 ENCOUNTER — Telehealth: Payer: Self-pay

## 2020-12-06 NOTE — Telephone Encounter (Signed)
Staff message received from Roxy Horseman RN at [facility] stating opening in schedule 12/08/2020 at 4:00pm (arrive at least 15 minutes early) and aware provider desired earlier appt than previously scheduled one on 01/05/2021. Affirmative reply sent to Ms. Neva Seat. Call to client with Salli Real interpreting and client notified of change in appt and verbalizes able to go to appt. Verbal directions to facility provided. Jossie Ng, RN

## 2020-12-08 ENCOUNTER — Other Ambulatory Visit: Payer: Self-pay | Admitting: Student

## 2020-12-08 ENCOUNTER — Ambulatory Visit: Payer: Self-pay | Attending: Maternal & Fetal Medicine

## 2020-12-08 ENCOUNTER — Ambulatory Visit: Payer: Self-pay

## 2020-12-08 ENCOUNTER — Other Ambulatory Visit: Payer: Self-pay

## 2020-12-08 DIAGNOSIS — Z641 Problems related to multiparity: Secondary | ICD-10-CM

## 2020-12-08 DIAGNOSIS — O99212 Obesity complicating pregnancy, second trimester: Secondary | ICD-10-CM

## 2020-12-08 DIAGNOSIS — O09522 Supervision of elderly multigravida, second trimester: Secondary | ICD-10-CM | POA: Insufficient documentation

## 2020-12-08 DIAGNOSIS — O09892 Supervision of other high risk pregnancies, second trimester: Secondary | ICD-10-CM | POA: Insufficient documentation

## 2020-12-08 DIAGNOSIS — O09899 Supervision of other high risk pregnancies, unspecified trimester: Secondary | ICD-10-CM

## 2020-12-08 DIAGNOSIS — E669 Obesity, unspecified: Secondary | ICD-10-CM | POA: Insufficient documentation

## 2020-12-08 DIAGNOSIS — Z3A14 14 weeks gestation of pregnancy: Secondary | ICD-10-CM | POA: Insufficient documentation

## 2020-12-08 NOTE — Progress Notes (Signed)
Ms. Larence Penning is a 40 yo [id number] at [redacted] weeks gestation by LMP.  Per [facility], she is measuring greater than dates.  She is here for an ultrasound and was scheduled for genetic counseling due to advanced maternal age.  In the notes from her last OB visit, it was stated that she declined all genetic testing and screening.  In addition, in Care Everywhere from 2016, she had declined genetic counseling and all testing as well.  I spoke with her briefly this afternoon with the aid of a remote interpreter, and she declined genetic counseling and testing at this time.  She was informed that this can be provided at any time if desired later in pregnancy.  We may be reached at [phone].  Cherly Anderson, MS, CGC

## 2020-12-13 ENCOUNTER — Ambulatory Visit: Payer: Self-pay | Attending: Oncology | Admitting: *Deleted

## 2020-12-13 VITALS — BP 128/84 | HR 94 | Temp 96.1°F | Ht 65.0 in | Wt 178.7 lb

## 2020-12-13 DIAGNOSIS — N6489 Other specified disorders of breast: Secondary | ICD-10-CM

## 2020-12-13 NOTE — Progress Notes (Signed)
Patient presents today for annual BCCCP.  Last mammogram on 12/11/19 was a birads 3.  Patient did not return for her 6 month follow up mammogram.  Claretha Cooper, the interpreter is present for the interview.  At this time the patient is [redacted] weeks pregnant.  Reviewed last mammogram and ultrasound.  There were no findings on the ultrasound, and a 6 month mammogram only was recommended.  Due to the patient's current pregnancy, I will have her return to [facility] after her delivery and breast feeding is completed.

## 2020-12-20 ENCOUNTER — Ambulatory Visit: Payer: Medicaid Other | Admitting: Family Medicine

## 2020-12-20 ENCOUNTER — Other Ambulatory Visit: Payer: Self-pay

## 2020-12-20 ENCOUNTER — Ambulatory Visit: Payer: Self-pay

## 2020-12-20 DIAGNOSIS — O09522 Supervision of elderly multigravida, second trimester: Secondary | ICD-10-CM

## 2020-12-20 DIAGNOSIS — O09521 Supervision of elderly multigravida, first trimester: Secondary | ICD-10-CM

## 2020-12-20 NOTE — Progress Notes (Signed)
Patient here for RV.   Patient declines QUAD screen. Informed patient if she changes her mind, she has up to 20 weeks and 6 days. Patient confirmed understanding.   Patient reminded of appointment for MFM on 01/05/21, patient was aware.   Patient states she would like her partner to have vasectomy for [facility] option but states he is very hesitant. If he does not go through it, she would like to have BTL even though she knows it will be more expensive. Patient has tried IUD but did not like it due to excessive bleeding. It was removed after 3 months. Patient did not like Depo because of weight gain. Patient does not want Nexplanon because she her friends who have had Nexplanon have gain weight.   Resources given to patient such as Administrator, arts, [facility] financial resource number, and list of resources for BTL.
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
## 2021-01-02 ENCOUNTER — Other Ambulatory Visit: Payer: Self-pay | Admitting: Advanced Practice Midwife

## 2021-01-02 DIAGNOSIS — O99212 Obesity complicating pregnancy, second trimester: Secondary | ICD-10-CM

## 2021-01-02 DIAGNOSIS — O09522 Supervision of elderly multigravida, second trimester: Secondary | ICD-10-CM

## 2021-01-02 DIAGNOSIS — O09899 Supervision of other high risk pregnancies, unspecified trimester: Secondary | ICD-10-CM

## 2021-01-05 ENCOUNTER — Ambulatory Visit: Payer: Self-pay | Attending: Obstetrics and Gynecology

## 2021-01-05 ENCOUNTER — Other Ambulatory Visit: Payer: Self-pay

## 2021-01-05 ENCOUNTER — Ambulatory Visit: Payer: Self-pay

## 2021-01-05 DIAGNOSIS — O99212 Obesity complicating pregnancy, second trimester: Secondary | ICD-10-CM | POA: Insufficient documentation

## 2021-01-05 DIAGNOSIS — O09522 Supervision of elderly multigravida, second trimester: Secondary | ICD-10-CM | POA: Insufficient documentation

## 2021-01-05 DIAGNOSIS — O09521 Supervision of elderly multigravida, first trimester: Secondary | ICD-10-CM

## 2021-01-05 DIAGNOSIS — Z3A18 18 weeks gestation of pregnancy: Secondary | ICD-10-CM | POA: Insufficient documentation

## 2021-01-11 NOTE — Addendum Note (Signed)
Addended by: Heywood Bene on: 01/11/2021 04:07 PM   Modules accepted: Orders

## 2021-01-12 ENCOUNTER — Ambulatory Visit: Payer: Self-pay

## 2021-01-17 ENCOUNTER — Ambulatory Visit: Payer: Medicaid Other | Admitting: Family Medicine

## 2021-01-17 ENCOUNTER — Other Ambulatory Visit: Payer: Self-pay

## 2021-01-17 VITALS — BP 113/68 | HR 93 | Temp 99.3°F | Wt 181.8 lb

## 2021-01-17 DIAGNOSIS — O99212 Obesity complicating pregnancy, second trimester: Secondary | ICD-10-CM

## 2021-01-17 DIAGNOSIS — O99210 Obesity complicating pregnancy, unspecified trimester: Secondary | ICD-10-CM

## 2021-01-17 DIAGNOSIS — O09522 Supervision of elderly multigravida, second trimester: Secondary | ICD-10-CM

## 2021-01-17 NOTE — Progress Notes (Signed)
Kept 01/05/2021 [place] appt. Jossie Ng, RN
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
## 2021-01-18 ENCOUNTER — Encounter: Payer: Self-pay | Admitting: Advanced Practice Midwife

## 2021-02-14 ENCOUNTER — Other Ambulatory Visit: Payer: Self-pay

## 2021-02-14 ENCOUNTER — Ambulatory Visit: Payer: Self-pay | Admitting: Family Medicine

## 2021-02-14 VITALS — BP 108/68 | HR 88 | Temp 99.2°F | Wt 181.8 lb

## 2021-02-14 DIAGNOSIS — Z641 Problems related to multiparity: Secondary | ICD-10-CM

## 2021-02-14 DIAGNOSIS — O0990 Supervision of high risk pregnancy, unspecified, unspecified trimester: Secondary | ICD-10-CM

## 2021-02-14 DIAGNOSIS — O09522 Supervision of elderly multigravida, second trimester: Secondary | ICD-10-CM

## 2021-02-14 NOTE — Progress Notes (Signed)
Here today for 23.6 week MH RV. Taking PNV and ASA QD. Denies ED/hospital visits since last RV. PTL s/s info given and explained. Tawny Hopping, RN
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
## 2021-03-14 ENCOUNTER — Ambulatory Visit: Payer: Self-pay | Admitting: Family Medicine

## 2021-03-14 ENCOUNTER — Other Ambulatory Visit: Payer: Self-pay

## 2021-03-14 VITALS — BP 119/74 | HR 82 | Temp 98.8°F | Wt 184.0 lb

## 2021-03-14 DIAGNOSIS — O0993 Supervision of high risk pregnancy, unspecified, third trimester: Secondary | ICD-10-CM

## 2021-03-14 DIAGNOSIS — O0990 Supervision of high risk pregnancy, unspecified, unspecified trimester: Secondary | ICD-10-CM

## 2021-03-14 DIAGNOSIS — Z23 Encounter for immunization: Secondary | ICD-10-CM

## 2021-03-14 LAB — HEMOGLOBIN, FINGERSTICK: Hemoglobin: 12.4 g/dL (ref 11.1–15.9)
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
## 2021-03-14 NOTE — Progress Notes (Signed)
Hgb reviewed during clinic visit. WNL.   Floy Sabina, RN

## 2021-03-15 ENCOUNTER — Telehealth: Payer: Self-pay

## 2021-03-15 DIAGNOSIS — O99810 Abnormal glucose complicating pregnancy: Secondary | ICD-10-CM | POA: Insufficient documentation

## 2021-03-15 LAB — HIV-1/HIV-2 QUALITATIVE RNA
HIV-1 RNA, Qualitative: NONREACTIVE
HIV-2 RNA, Qualitative: NONREACTIVE

## 2021-03-15 LAB — GLUCOSE, 1 HOUR GESTATIONAL: Gestational Diabetes Screen: 167 mg/dL — ABNORMAL HIGH (ref 65–139)

## 2021-03-15 LAB — RPR: RPR Ser Ql: NONREACTIVE

## 2021-03-15 NOTE — Telephone Encounter (Signed)
1 hour glucola = 167 and needs 3 hour GTT. Call to client with [facility] [employer] ID # [phone] and left message to call regarding test results. Number to call provided. Jossie Ng, RN

## 2021-03-15 NOTE — Telephone Encounter (Signed)
1 hour glucola = 167 and needs 3 hour gtt. Call to client with [facility] [employer] ID # [phone] and left message to call regarding test results. Number to call provided. Jossie Ng, RN

## 2021-03-16 NOTE — Telephone Encounter (Signed)
TC to patient to inform of abnormal 1 hour gtt results and need for 3 hour gtt. Explained to patient that she will need to fast from 8 pm the night before the test, with no food, water, candy or gum, until after completion of the test on 03/21/2021. Patient counseled she may have small sips of water if needed. Patient counseled she will need to remain here for the morning of 03/21/2021 until all lab draws are complete. Patient informed to check in at 0800 on 03/21/2021. Patient states understanding and denies questions at this time. Interpreter V. Olmedo.[name], RN

## 2021-03-21 ENCOUNTER — Other Ambulatory Visit: Payer: Self-pay

## 2021-03-21 DIAGNOSIS — O99810 Abnormal glucose complicating pregnancy: Secondary | ICD-10-CM

## 2021-03-21 NOTE — Progress Notes (Signed)
In Nurse Clinic for 3 hr GTT. Juliene Pina, interpreter. Pt reports npo since last night at 8 pm. Instructions given for test today: npo, lab draw times, stay on first floor, notify RN if n/v, may leave after last lab draw. Next [facility] appt 03/28/2021, pt aware. Jerel Shepherd, RN

## 2021-03-22 LAB — GLUCOSE TOLERANCE TEST, 6 HOUR
Glucose, 1 Hour GTT: 160 mg/dL (ref 65–199)
Glucose, 2 hour: 142 mg/dL — ABNORMAL HIGH (ref 65–139)
Glucose, 3 hour: 109 mg/dL (ref 65–109)
Glucose, GTT - Fasting: 81 mg/dL (ref 65–99)

## 2021-03-28 ENCOUNTER — Other Ambulatory Visit: Payer: Self-pay

## 2021-03-28 ENCOUNTER — Ambulatory Visit: Payer: Self-pay | Admitting: Family Medicine

## 2021-03-28 VITALS — BP 123/67 | HR 91 | Temp 99.1°F | Wt 184.6 lb

## 2021-03-28 DIAGNOSIS — O0993 Supervision of high risk pregnancy, unspecified, third trimester: Secondary | ICD-10-CM

## 2021-03-28 DIAGNOSIS — O99210 Obesity complicating pregnancy, unspecified trimester: Secondary | ICD-10-CM

## 2021-03-28 DIAGNOSIS — O99213 Obesity complicating pregnancy, third trimester: Secondary | ICD-10-CM

## 2021-03-28 DIAGNOSIS — O0990 Supervision of high risk pregnancy, unspecified, unspecified trimester: Secondary | ICD-10-CM

## 2021-03-28 DIAGNOSIS — O09522 Supervision of elderly multigravida, second trimester: Secondary | ICD-10-CM

## 2021-03-28 DIAGNOSIS — O09523 Supervision of elderly multigravida, third trimester: Secondary | ICD-10-CM
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
## 2021-03-28 NOTE — Progress Notes (Signed)
Here today for 29.6 week MH RV. Taking PNV and ASA QD. Denies ED/hospital visits since last RV. Tawny Hopping, RN

## 2021-04-11 ENCOUNTER — Telehealth: Payer: Self-pay

## 2021-04-11 ENCOUNTER — Ambulatory Visit: Payer: Self-pay | Admitting: Family Medicine

## 2021-04-11 ENCOUNTER — Other Ambulatory Visit: Payer: Self-pay

## 2021-04-11 VITALS — BP 117/62 | HR 94 | Temp 99.2°F | Wt 188.0 lb

## 2021-04-11 DIAGNOSIS — O09522 Supervision of elderly multigravida, second trimester: Secondary | ICD-10-CM

## 2021-04-11 DIAGNOSIS — O0990 Supervision of high risk pregnancy, unspecified, unspecified trimester: Secondary | ICD-10-CM

## 2021-04-11 DIAGNOSIS — O99210 Obesity complicating pregnancy, unspecified trimester: Secondary | ICD-10-CM | POA: Insufficient documentation

## 2021-04-11 DIAGNOSIS — O99213 Obesity complicating pregnancy, third trimester: Secondary | ICD-10-CM

## 2021-04-11 DIAGNOSIS — O09523 Supervision of elderly multigravida, third trimester: Secondary | ICD-10-CM

## 2021-04-11 DIAGNOSIS — O0993 Supervision of high risk pregnancy, unspecified, third trimester: Secondary | ICD-10-CM

## 2021-04-11 DIAGNOSIS — Z8751 Personal history of pre-term labor: Secondary | ICD-10-CM

## 2021-04-11 NOTE — Progress Notes (Signed)
[facility] referral for 32 week fetal growth assessment faxed with snapshot pages. Confirmation received. Call to [facility] and appt scheduled for 04/18/2021 with arrival time of 1045. Jossie Ng, RN
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
## 2021-04-11 NOTE — Telephone Encounter (Signed)
Call to client to notify her of [place] appt and left message to call with number provided. Salli Real interpreted during call. Jossie Ng, RN

## 2021-04-11 NOTE — Telephone Encounter (Signed)
Call to client with [facility] fetal growth assessment 32 week [place] appt of 04/18/2021 at 1100 (arrive 1045). Left message to call for appt and number provided. Roddie Mc Yemen interpreted during appt. Jossie Ng, RN

## 2021-04-11 NOTE — Telephone Encounter (Signed)
Client notified of [place] appt with Marlene Yemen interpreting during call. Jossie Ng, RN

## 2021-04-18 ENCOUNTER — Ambulatory Visit: Payer: Self-pay | Attending: Obstetrics and Gynecology

## 2021-04-18 ENCOUNTER — Other Ambulatory Visit: Payer: Self-pay

## 2021-04-18 VITALS — BP 122/80 | HR 80 | Temp 98.5°F | Ht 65.0 in | Wt 186.5 lb

## 2021-04-18 DIAGNOSIS — O99213 Obesity complicating pregnancy, third trimester: Secondary | ICD-10-CM | POA: Insufficient documentation

## 2021-04-18 DIAGNOSIS — E669 Obesity, unspecified: Secondary | ICD-10-CM

## 2021-04-18 DIAGNOSIS — O09523 Supervision of elderly multigravida, third trimester: Secondary | ICD-10-CM | POA: Insufficient documentation

## 2021-04-18 DIAGNOSIS — Z3A32 32 weeks gestation of pregnancy: Secondary | ICD-10-CM | POA: Insufficient documentation

## 2021-04-18 DIAGNOSIS — Z8759 Personal history of other complications of pregnancy, childbirth and the puerperium: Secondary | ICD-10-CM | POA: Insufficient documentation

## 2021-04-18 DIAGNOSIS — O0990 Supervision of high risk pregnancy, unspecified, unspecified trimester: Secondary | ICD-10-CM

## 2021-04-18 DIAGNOSIS — O09522 Supervision of elderly multigravida, second trimester: Secondary | ICD-10-CM

## 2021-04-18 DIAGNOSIS — O99810 Abnormal glucose complicating pregnancy: Secondary | ICD-10-CM

## 2021-04-25 ENCOUNTER — Other Ambulatory Visit: Payer: Self-pay

## 2021-04-25 ENCOUNTER — Ambulatory Visit: Payer: Self-pay | Admitting: Family Medicine

## 2021-04-25 VITALS — BP 108/67 | HR 88 | Temp 98.8°F | Wt 185.8 lb

## 2021-04-25 DIAGNOSIS — O0993 Supervision of high risk pregnancy, unspecified, third trimester: Secondary | ICD-10-CM

## 2021-04-25 DIAGNOSIS — O99210 Obesity complicating pregnancy, unspecified trimester: Secondary | ICD-10-CM

## 2021-04-25 DIAGNOSIS — O99213 Obesity complicating pregnancy, third trimester: Secondary | ICD-10-CM

## 2021-04-25 DIAGNOSIS — O0990 Supervision of high risk pregnancy, unspecified, unspecified trimester: Secondary | ICD-10-CM

## 2021-04-25 NOTE — Progress Notes (Signed)
Patient here at [redacted]w[redacted]d for MH RV.   Patient made aware of follow-up ultrasounds scheduled:   05/18/2021 10:30 AM [place] [facility]FM FETAL BPP WO NON STRESS - pt aware  05/18/2021 11:00 AM [place] MFM OB FOLLOW UP - pt aware  05/25/2021  9:00 AM [place] MFM FETAL BPP WO NON STRESS - pt aware  Patient was also given [facility] financial resource phone number and financial application.   Floy Sabina, RN
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
## 2021-05-02 ENCOUNTER — Observation Stay
Admission: EM | Admit: 2021-05-02 | Discharge: 2021-05-02 | Disposition: A | Discharge status: Home / Self Care | Payer: Self-pay | Attending: Obstetrics | Admitting: Obstetrics

## 2021-05-02 ENCOUNTER — Other Ambulatory Visit: Payer: Self-pay

## 2021-05-02 ENCOUNTER — Encounter: Payer: Self-pay | Admitting: Obstetrics and Gynecology

## 2021-05-02 DIAGNOSIS — O0990 Supervision of high risk pregnancy, unspecified, unspecified trimester: Secondary | ICD-10-CM

## 2021-05-02 DIAGNOSIS — O4290 Premature rupture of membranes, unspecified as to length of time between rupture and onset of labor, unspecified weeks of gestation: Secondary | ICD-10-CM | POA: Diagnosis present

## 2021-05-02 DIAGNOSIS — E669 Obesity, unspecified: Secondary | ICD-10-CM | POA: Insufficient documentation

## 2021-05-02 DIAGNOSIS — O99213 Obesity complicating pregnancy, third trimester: Secondary | ICD-10-CM | POA: Insufficient documentation

## 2021-05-02 DIAGNOSIS — Z7982 Long term (current) use of aspirin: Secondary | ICD-10-CM | POA: Insufficient documentation

## 2021-05-02 DIAGNOSIS — Z3A34 34 weeks gestation of pregnancy: Secondary | ICD-10-CM | POA: Insufficient documentation

## 2021-05-02 DIAGNOSIS — O09523 Supervision of elderly multigravida, third trimester: Principal | ICD-10-CM | POA: Insufficient documentation

## 2021-05-02 DIAGNOSIS — O99810 Abnormal glucose complicating pregnancy: Secondary | ICD-10-CM | POA: Insufficient documentation

## 2021-05-02 HISTORY — DX: Other specified health status: Z78.9

## 2021-05-02 LAB — URINALYSIS, ROUTINE W REFLEX MICROSCOPIC
Bilirubin Urine: NEGATIVE
Glucose, UA: NEGATIVE mg/dL
Ketones, ur: NEGATIVE mg/dL
Leukocytes,Ua: NEGATIVE
Nitrite: NEGATIVE
Protein, ur: 30 mg/dL — AB
Specific Gravity, Urine: 1.008 (ref 1.005–1.030)
pH: 6 (ref 5.0–8.0)

## 2021-05-02 LAB — WET PREP, GENITAL
Clue Cells Wet Prep HPF POC: NONE SEEN
Sperm: NONE SEEN
Trich, Wet Prep: NONE SEEN
Yeast Wet Prep HPF POC: NONE SEEN

## 2021-05-02 LAB — RUPTURE OF MEMBRANE (ROM)PLUS: Rom Plus: NEGATIVE

## 2021-05-02 MED ORDER — LACTATED RINGERS IV BOLUS
1000.0000 mL | Freq: Once | INTRAVENOUS | Status: AC
  Administered 2021-05-02: 1000 mL via INTRAVENOUS
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
## 2021-05-02 NOTE — OB Triage Note (Signed)
Pt is a 40y/o [id number] at [redacted]w[redacted]d with c/o SROM at 230AM. Pt states she woke up to take a tylenol for a headache. Pt states fluid was clear and it was enough fluid to puddle on the floor. Pt states +FM before rupture but decreased after. Pt denies CTX and VB. Monitors applied and assessing. Initial FHT 160.

## 2021-05-09 ENCOUNTER — Other Ambulatory Visit: Payer: Self-pay

## 2021-05-09 ENCOUNTER — Ambulatory Visit: Payer: Self-pay | Admitting: Advanced Practice Midwife

## 2021-05-09 VITALS — BP 107/69 | HR 91 | Temp 99.0°F | Wt 186.0 lb

## 2021-05-09 DIAGNOSIS — Z641 Problems related to multiparity: Secondary | ICD-10-CM

## 2021-05-09 DIAGNOSIS — O09522 Supervision of elderly multigravida, second trimester: Secondary | ICD-10-CM

## 2021-05-09 DIAGNOSIS — O0993 Supervision of high risk pregnancy, unspecified, third trimester: Secondary | ICD-10-CM

## 2021-05-09 DIAGNOSIS — Z8751 Personal history of pre-term labor: Secondary | ICD-10-CM

## 2021-05-09 DIAGNOSIS — O0990 Supervision of high risk pregnancy, unspecified, unspecified trimester: Secondary | ICD-10-CM

## 2021-05-09 DIAGNOSIS — O99210 Obesity complicating pregnancy, unspecified trimester: Secondary | ICD-10-CM

## 2021-05-09 DIAGNOSIS — O99213 Obesity complicating pregnancy, third trimester: Secondary | ICD-10-CM

## 2021-05-09 LAB — URINALYSIS
Bilirubin, UA: POSITIVE — AB
Glucose, UA: NEGATIVE
Leukocytes,UA: NEGATIVE
Nitrite, UA: NEGATIVE
Specific Gravity, UA: 1.03 (ref 1.005–1.030)
Urobilinogen, Ur: 0.2 mg/dL (ref 0.2–1.0)
pH, UA: 5.5 (ref 5.0–7.5)

## 2021-05-09 NOTE — Progress Notes (Signed)
Urinalysis reviewed during clinic visit - added urine culture as follow-up.   Floy Sabina, RN
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
## 2021-05-12 ENCOUNTER — Other Ambulatory Visit: Payer: Self-pay | Admitting: Obstetrics and Gynecology

## 2021-05-12 DIAGNOSIS — O0943 Supervision of pregnancy with grand multiparity, third trimester: Secondary | ICD-10-CM

## 2021-05-12 DIAGNOSIS — O99213 Obesity complicating pregnancy, third trimester: Secondary | ICD-10-CM

## 2021-05-12 DIAGNOSIS — O09523 Supervision of elderly multigravida, third trimester: Secondary | ICD-10-CM

## 2021-05-12 LAB — URINE CULTURE

## 2021-05-16 ENCOUNTER — Other Ambulatory Visit: Payer: Self-pay

## 2021-05-18 ENCOUNTER — Ambulatory Visit: Payer: Self-pay | Admitting: Physician Assistant

## 2021-05-18 ENCOUNTER — Encounter: Payer: Self-pay | Admitting: Physician Assistant

## 2021-05-18 ENCOUNTER — Other Ambulatory Visit: Payer: Self-pay

## 2021-05-18 ENCOUNTER — Ambulatory Visit: Payer: Self-pay | Attending: Maternal & Fetal Medicine

## 2021-05-18 ENCOUNTER — Encounter: Payer: Self-pay | Admitting: Advanced Practice Midwife

## 2021-05-18 VITALS — BP 123/73 | HR 86 | Temp 97.7°F

## 2021-05-18 DIAGNOSIS — O99210 Obesity complicating pregnancy, unspecified trimester: Secondary | ICD-10-CM

## 2021-05-18 DIAGNOSIS — O0993 Supervision of high risk pregnancy, unspecified, third trimester: Secondary | ICD-10-CM

## 2021-05-18 DIAGNOSIS — Z3A37 37 weeks gestation of pregnancy: Secondary | ICD-10-CM | POA: Insufficient documentation

## 2021-05-18 DIAGNOSIS — E669 Obesity, unspecified: Secondary | ICD-10-CM | POA: Insufficient documentation

## 2021-05-18 DIAGNOSIS — O0943 Supervision of pregnancy with grand multiparity, third trimester: Secondary | ICD-10-CM | POA: Insufficient documentation

## 2021-05-18 DIAGNOSIS — Z23 Encounter for immunization: Secondary | ICD-10-CM

## 2021-05-18 DIAGNOSIS — O0990 Supervision of high risk pregnancy, unspecified, unspecified trimester: Secondary | ICD-10-CM

## 2021-05-18 DIAGNOSIS — O99213 Obesity complicating pregnancy, third trimester: Secondary | ICD-10-CM | POA: Insufficient documentation

## 2021-05-18 DIAGNOSIS — O09523 Supervision of elderly multigravida, third trimester: Secondary | ICD-10-CM | POA: Insufficient documentation

## 2021-05-18 NOTE — Progress Notes (Signed)
Patient here for MH RV at 37 1/7. 36 week labs today, patient would like provider to collect labs. 36 week packet given today.[name], RN
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
## 2021-05-18 NOTE — Progress Notes (Signed)
Flu vaccine given, tolerated well, VIS given, NCIR in accordian folder.[name], RN

## 2021-05-19 ENCOUNTER — Other Ambulatory Visit: Payer: Self-pay | Admitting: Maternal & Fetal Medicine

## 2021-05-19 DIAGNOSIS — O09523 Supervision of elderly multigravida, third trimester: Secondary | ICD-10-CM

## 2021-05-19 DIAGNOSIS — O99213 Obesity complicating pregnancy, third trimester: Secondary | ICD-10-CM

## 2021-05-19 DIAGNOSIS — O0943 Supervision of pregnancy with grand multiparity, third trimester: Secondary | ICD-10-CM

## 2021-05-22 LAB — CULTURE, BETA STREP (GROUP B ONLY): Strep Gp B Culture: NEGATIVE

## 2021-05-24 LAB — CHLAMYDIA/GC NAA, CONFIRMATION
Chlamydia trachomatis, NAA: NEGATIVE
Neisseria gonorrhoeae, NAA: NEGATIVE

## 2021-05-25 ENCOUNTER — Other Ambulatory Visit: Payer: Self-pay

## 2021-05-26 ENCOUNTER — Ambulatory Visit: Payer: Self-pay | Admitting: Family Medicine

## 2021-05-26 ENCOUNTER — Other Ambulatory Visit: Payer: Self-pay

## 2021-05-26 VITALS — BP 129/72 | HR 91 | Temp 97.9°F | Wt 188.1 lb

## 2021-05-26 DIAGNOSIS — O0993 Supervision of high risk pregnancy, unspecified, third trimester: Secondary | ICD-10-CM

## 2021-05-26 DIAGNOSIS — O99210 Obesity complicating pregnancy, unspecified trimester: Secondary | ICD-10-CM

## 2021-05-26 DIAGNOSIS — O99213 Obesity complicating pregnancy, third trimester: Secondary | ICD-10-CM

## 2021-05-26 DIAGNOSIS — O0990 Supervision of high risk pregnancy, unspecified, unspecified trimester: Secondary | ICD-10-CM
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
## 2021-06-02 ENCOUNTER — Ambulatory Visit: Payer: Self-pay | Admitting: Family Medicine

## 2021-06-02 ENCOUNTER — Other Ambulatory Visit: Payer: Self-pay

## 2021-06-02 VITALS — BP 117/72 | HR 87 | Temp 98.7°F | Wt 188.4 lb

## 2021-06-02 DIAGNOSIS — O0993 Supervision of high risk pregnancy, unspecified, third trimester: Secondary | ICD-10-CM

## 2021-06-02 DIAGNOSIS — O0990 Supervision of high risk pregnancy, unspecified, unspecified trimester: Secondary | ICD-10-CM
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
## 2021-06-02 NOTE — Progress Notes (Signed)
IOL form faxed with confirmation.[name], RN

## 2021-06-02 NOTE — Progress Notes (Addendum)
Patient here for MH RV at 39 2/7. Had urine dip with 2+ protein at 05/09/21 visit. Sent to lab for urine dip after consult with provider.. Needs IOL referral completed today.[name], RN

## 2021-06-03 LAB — PROTEIN / CREATININE RATIO, URINE
Creatinine, Urine: 134.5 mg/dL
Protein, Ur: 212.6 mg/dL
Protein/Creat Ratio: 1581 mg/g creat — ABNORMAL HIGH (ref 0–200)

## 2021-06-05 ENCOUNTER — Inpatient Hospital Stay
Admission: EM | Admit: 2021-06-05 | Discharge: 2021-06-07 | DRG: 807 | Disposition: A | Discharge status: Home / Self Care | Payer: Medicaid Other | Attending: Obstetrics | Admitting: Obstetrics

## 2021-06-05 ENCOUNTER — Encounter: Payer: Self-pay | Admitting: Obstetrics and Gynecology

## 2021-06-05 ENCOUNTER — Inpatient Hospital Stay: Payer: Medicaid Other | Admitting: Anesthesiology

## 2021-06-05 ENCOUNTER — Encounter: Payer: Self-pay | Admitting: Advanced Practice Midwife

## 2021-06-05 ENCOUNTER — Telehealth: Payer: Self-pay

## 2021-06-05 ENCOUNTER — Other Ambulatory Visit: Payer: Self-pay

## 2021-06-05 DIAGNOSIS — O99810 Abnormal glucose complicating pregnancy: Secondary | ICD-10-CM

## 2021-06-05 DIAGNOSIS — O26893 Other specified pregnancy related conditions, third trimester: Secondary | ICD-10-CM | POA: Diagnosis present

## 2021-06-05 DIAGNOSIS — Z8616 Personal history of COVID-19: Secondary | ICD-10-CM | POA: Diagnosis not present

## 2021-06-05 DIAGNOSIS — Z3A39 39 weeks gestation of pregnancy: Secondary | ICD-10-CM

## 2021-06-05 DIAGNOSIS — O9081 Anemia of the puerperium: Secondary | ICD-10-CM | POA: Diagnosis not present

## 2021-06-05 DIAGNOSIS — O0990 Supervision of high risk pregnancy, unspecified, unspecified trimester: Secondary | ICD-10-CM

## 2021-06-05 DIAGNOSIS — Z3490 Encounter for supervision of normal pregnancy, unspecified, unspecified trimester: Secondary | ICD-10-CM

## 2021-06-05 DIAGNOSIS — O99214 Obesity complicating childbirth: Secondary | ICD-10-CM | POA: Diagnosis present

## 2021-06-05 DIAGNOSIS — Z20822 Contact with and (suspected) exposure to covid-19: Secondary | ICD-10-CM | POA: Diagnosis present

## 2021-06-05 DIAGNOSIS — O1210 Gestational proteinuria, unspecified trimester: Secondary | ICD-10-CM | POA: Insufficient documentation

## 2021-06-05 HISTORY — DX: Encounter for supervision of normal pregnancy, unspecified, unspecified trimester: Z34.90

## 2021-06-05 LAB — COMPREHENSIVE METABOLIC PANEL
ALT: 20 U/L (ref 0–44)
AST: 24 U/L (ref 15–41)
Albumin: 2.9 g/dL — ABNORMAL LOW (ref 3.5–5.0)
Alkaline Phosphatase: 140 U/L — ABNORMAL HIGH (ref 38–126)
Anion gap: 8 (ref 5–15)
BUN: 8 mg/dL (ref 6–20)
CO2: 21 mmol/L — ABNORMAL LOW (ref 22–32)
Calcium: 8.5 mg/dL — ABNORMAL LOW (ref 8.9–10.3)
Chloride: 105 mmol/L (ref 98–111)
Creatinine, Ser: 0.44 mg/dL (ref 0.44–1.00)
GFR, Estimated: >60 mL/min (ref >60)
Glucose, Bld: 90 mg/dL (ref 70–99)
Potassium: 3.7 mmol/L (ref 3.5–5.1)
Sodium: 134 mmol/L — ABNORMAL LOW (ref 135–145)
Total Bilirubin: 0.4 mg/dL (ref 0.3–1.2)
Total Protein: 6.5 g/dL (ref 6.5–8.1)

## 2021-06-05 LAB — CBC
HCT: 37.6 % (ref 36.0–46.0)
Hemoglobin: 13.4 g/dL (ref 12.0–15.0)
MCH: 33 pg (ref 26.0–34.0)
MCHC: 35.6 g/dL (ref 30.0–36.0)
MCV: 92.6 fL (ref 80.0–100.0)
Platelets: 201 10*3/uL (ref 150–400)
RBC: 4.06 MIL/uL (ref 3.87–5.11)
RDW: 12.8 % (ref 11.5–15.5)
WBC: 8.4 10*3/uL (ref 4.0–10.5)
nRBC: 0 % (ref 0.0–0.2)

## 2021-06-05 LAB — TYPE AND SCREEN
ABO/RH(D): B POS
Antibody Screen: NEGATIVE

## 2021-06-05 LAB — RESP PANEL BY RT-PCR (FLU A&B, COVID) ARPGX2
Influenza A by PCR: NEGATIVE
Influenza B by PCR: NEGATIVE
SARS Coronavirus 2 by RT PCR: NEGATIVE

## 2021-06-05 LAB — PROTEIN / CREATININE RATIO, URINE
Creatinine, Urine: 50 mg/dL
Protein Creatinine Ratio: 1.46 mg/mg{Cre} — ABNORMAL HIGH (ref 0.00–0.15)
Total Protein, Urine: 73 mg/dL

## 2021-06-05 MED ORDER — TRANEXAMIC ACID 1000 MG/10ML IV SOLN
INTRAVENOUS | Status: AC
  Filled 2021-06-05: qty 10

## 2021-06-05 MED ORDER — BUPIVACAINE HCL (PF) 0.25 % IJ SOLN
INTRAMUSCULAR | Status: DC | PRN
  Administered 2021-06-05: 5 mL via EPIDURAL
  Administered 2021-06-05: 6 mL via EPIDURAL

## 2021-06-05 MED ORDER — BUTORPHANOL TARTRATE 1 MG/ML IJ SOLN: 1.0000 mg | INTRAMUSCULAR | Status: DC | PRN

## 2021-06-05 MED ORDER — PHENYLEPHRINE 40 MCG/ML (10ML) SYRINGE FOR IV PUSH (FOR BLOOD PRESSURE SUPPORT): 80.0000 ug | PREFILLED_SYRINGE | INTRAVENOUS | Status: DC | PRN

## 2021-06-05 MED ORDER — LABETALOL HCL 5 MG/ML IV SOLN: 20.0000 mg | INTRAVENOUS | Status: DC | PRN

## 2021-06-05 MED ORDER — METHYLERGONOVINE MALEATE 0.2 MG/ML IJ SOLN
INTRAMUSCULAR | Status: AC
  Filled 2021-06-05: qty 1

## 2021-06-05 MED ORDER — LACTATED RINGERS IV SOLN: 500.0000 mL | Freq: Once | INTRAVENOUS | Status: DC

## 2021-06-05 MED ORDER — FENTANYL-BUPIVACAINE-NACL 0.5-0.125-0.9 MG/250ML-% EP SOLN
EPIDURAL | Status: AC
  Filled 2021-06-05: qty 250

## 2021-06-05 MED ORDER — SOD CITRATE-CITRIC ACID 500-334 MG/5ML PO SOLN: 30.0000 mL | ORAL | Status: DC | PRN

## 2021-06-05 MED ORDER — CARBOPROST TROMETHAMINE 250 MCG/ML IM SOLN
INTRAMUSCULAR | Status: AC
  Filled 2021-06-05: qty 1

## 2021-06-05 MED ORDER — MISOPROSTOL 200 MCG PO TABS
ORAL_TABLET | ORAL | Status: AC
  Filled 2021-06-05: qty 4

## 2021-06-05 MED ORDER — OXYTOCIN 10 UNIT/ML IJ SOLN
INTRAMUSCULAR | Status: AC
  Filled 2021-06-05: qty 2

## 2021-06-05 MED ORDER — EPHEDRINE 5 MG/ML INJ: 10.0000 mg | INTRAVENOUS | Status: DC | PRN

## 2021-06-05 MED ORDER — OXYTOCIN-SODIUM CHLORIDE 30-0.9 UT/500ML-% IV SOLN
1.0000 m[IU]/min | INTRAVENOUS | Status: DC
  Administered 2021-06-05: 2 m[IU]/min via INTRAVENOUS
  Filled 2021-06-05 (×2): qty 500

## 2021-06-05 MED ORDER — FENTANYL-BUPIVACAINE-NACL 0.5-0.125-0.9 MG/250ML-% EP SOLN
EPIDURAL | Status: DC | PRN
  Administered 2021-06-05: 12 mL/h via EPIDURAL

## 2021-06-05 MED ORDER — MISOPROSTOL 200 MCG PO TABS
ORAL_TABLET | ORAL | Status: AC
  Filled 2021-06-05: qty 5

## 2021-06-05 MED ORDER — ONDANSETRON HCL 4 MG/2ML IJ SOLN: 4.0000 mg | Freq: Four times a day (QID) | INTRAMUSCULAR | Status: DC | PRN

## 2021-06-05 MED ORDER — LIDOCAINE-EPINEPHRINE (PF) 1.5 %-1:200000 IJ SOLN
INTRAMUSCULAR | Status: DC | PRN
  Administered 2021-06-05: 3 mL via EPIDURAL

## 2021-06-05 MED ORDER — LACTATED RINGERS IV SOLN
INTRAVENOUS | Status: DC
Start: 2021-06-05 — End: 2021-06-06

## 2021-06-05 MED ORDER — LIDOCAINE HCL (PF) 1 % IJ SOLN
INTRAMUSCULAR | Status: AC
  Filled 2021-06-05: qty 30

## 2021-06-05 MED ORDER — HYDRALAZINE HCL 20 MG/ML IJ SOLN: 10.0000 mg | INTRAMUSCULAR | Status: DC | PRN

## 2021-06-05 MED ORDER — OXYTOCIN-SODIUM CHLORIDE 30-0.9 UT/500ML-% IV SOLN
2.5000 [IU]/h | INTRAVENOUS | Status: DC
  Administered 2021-06-06 (×2): 2.5 [IU]/h via INTRAVENOUS
  Filled 2021-06-05: qty 500

## 2021-06-05 MED ORDER — LABETALOL HCL 5 MG/ML IV SOLN: 40.0000 mg | INTRAVENOUS | Status: DC | PRN

## 2021-06-05 MED ORDER — TERBUTALINE SULFATE 1 MG/ML IJ SOLN: 0.2500 mg | Freq: Once | INTRAMUSCULAR | Status: DC | PRN

## 2021-06-05 MED ORDER — LABETALOL HCL 5 MG/ML IV SOLN: 80.0000 mg | INTRAVENOUS | Status: DC | PRN

## 2021-06-05 MED ORDER — DIPHENHYDRAMINE HCL 50 MG/ML IJ SOLN
12.5000 mg | INTRAMUSCULAR | Status: DC | PRN
  Administered 2021-06-05: 12.5 mg via INTRAVENOUS
  Filled 2021-06-05: qty 1

## 2021-06-05 MED ORDER — ACETAMINOPHEN 325 MG PO TABS: 650.0000 mg | ORAL_TABLET | ORAL | Status: DC | PRN

## 2021-06-05 MED ORDER — LACTATED RINGERS IV SOLN
500.0000 mL | INTRAVENOUS | Status: DC | PRN
  Administered 2021-06-05: 500 mL via INTRAVENOUS

## 2021-06-05 MED ORDER — OXYTOCIN BOLUS FROM INFUSION
333.0000 mL | Freq: Once | INTRAVENOUS | Status: DC
Start: 2021-06-05 — End: 2021-06-06
  Administered 2021-06-06: 333 mL via INTRAVENOUS

## 2021-06-05 MED ORDER — FENTANYL-BUPIVACAINE-NACL 0.5-0.125-0.9 MG/250ML-% EP SOLN: 12.0000 mL/h | EPIDURAL | Status: DC | PRN

## 2021-06-05 MED ORDER — LIDOCAINE HCL (PF) 1 % IJ SOLN
30.0000 mL | INTRAMUSCULAR | Status: DC | PRN
Start: 2021-06-05 — End: 2021-06-06

## 2021-06-05 MED ORDER — AMMONIA AROMATIC IN INHA
RESPIRATORY_TRACT | Status: AC
  Filled 2021-06-05: qty 10

## 2021-06-05 NOTE — Progress Notes (Signed)
Labor Progress Note  Natasha Mckee is a 40 y.o. [id number] at [redacted]w[redacted]d by LMP admitted for induction of labor due to elevated P/C ratio, obesity, AMA.  Subjective: resting in bed comfortably with partner at bedside. Denies pain at this time  Objective: BP 126/78   Pulse 82   Temp 99.6 F (37.6 C) (Oral)   Resp 16   LMP 08/31/2020 (Exact Date)  Notable VS details:   Fetal Assessment: FHT:  FHR: 130 bpm, variability: moderate,  accelerations:  Present,  decelerations:  Absent Category/reactivity:  Category I UC:   irregular, every 2-9 minutes SVE:    Membrane status: intact Amniotic color: intact  Labs: Lab Results  Component Value Date   WBC 8.4 06/05/2021   HGB 13.4 06/05/2021   HCT 37.6 06/05/2021   MCV 92.6 06/05/2021   PLT 201 06/05/2021    Assessment / Plan: Induction of labor due to AMA, elevated PCR,  progressing well on pitocin  Labor:  cervical balloon and pitocin started Preeclampsia:   BP normotensive  Fetal Wellbeing:  Category I Pain Control:  Labor support without medications I/D:  n/a Anticipated MOD:  NSVD  Adrielle Polakowski LUCY LORENA Wilber Fini, CNM 06/05/2021, 7:15 PM

## 2021-06-05 NOTE — Telephone Encounter (Signed)
Per telephone call from Katharina Caper CNM at [facility], IOL for client has been scheduled. She is to arrive to L & D at midnight on Thursday 06/08/2021 via the ED. Call to client with above information. Per Hazle Coca CNM, who talked with Ms. Rubye Oaks after RN, counsel client to expect call from Ms. Rubye Oaks regarding pre-IOL evaluation at [facility] or [facility] and client so counseled. Marlen Yemen interpreted during call. Jossie Ng, RN

## 2021-06-05 NOTE — Progress Notes (Signed)
Pt presented to L/D from health department per MD for evaluation and admission. Pt reports no pain, bleeding, or LOF and positive fetal movement. Pt reports no headache, blurry vision, epigastric pain, or atypical edema.  Initial BP 142/92- cycling. CNM to be notified of pt arrival to unit.

## 2021-06-05 NOTE — Telephone Encounter (Signed)
Per Hazle Coca CNM, client needs L & D evaluation now due to elevated urine spot results. Approximately 10 minutes ago, call made to client with Marlene Yemen and advised to go now for above evaluation. Per client, she is agreeable with and understands plan. Jossie Ng, RN

## 2021-06-05 NOTE — Anesthesia Procedure Notes (Signed)
Epidural Patient location during procedure: OB Start time: 06/05/2021 10:22 PM End time: 06/05/2021 10:34 PM  Staffing Anesthesiologist: Foye Deer, MD Performed: anesthesiologist   Preanesthetic Checklist Completed: patient identified, IV checked, site marked, risks and benefits discussed, surgical consent, monitors and equipment checked, pre-op evaluation and timeout performed  Epidural Patient position: sitting Prep: ChloraPrep Patient monitoring: heart rate, continuous pulse ox and blood pressure Approach: midline Location: L3-L4 Injection technique: LOR saline  Needle:  Needle type: Tuohy  Needle gauge: 18 G Needle length: 9 cm Needle insertion depth: 5 cm Catheter type: closed end Catheter size: 20 Guage Catheter at skin depth: 9 cm Test dose: negative and 1.5% lidocaine with Epi 1:200 K  Assessment Events: blood not aspirated  Additional Notes Reason for block:procedure for pain

## 2021-06-05 NOTE — Anesthesia Preprocedure Evaluation (Signed)
Anesthesia Evaluation  Patient identified by MRN, date of birth, ID band Patient awake    Reviewed: Allergy & Precautions, NPO status , Patient's Chart, lab work & pertinent test results  Airway Mallampati: III  TM Distance: >3 FB     Dental no notable dental hx.    Pulmonary neg pulmonary ROS,    Pulmonary exam normal        Cardiovascular negative cardio ROS Normal cardiovascular exam     Neuro/Psych negative neurological ROS  negative psych ROS   GI/Hepatic negative GI ROS, Neg liver ROS,   Endo/Other  negative endocrine ROS  Renal/GU negative Renal ROS  negative genitourinary   Musculoskeletal negative musculoskeletal ROS (+)   Abdominal   Peds negative pediatric ROS (+)  Hematology negative hematology ROS (+)   Anesthesia Other Findings gravid  Reproductive/Obstetrics (+) Pregnancy                             Anesthesia Physical Anesthesia Plan  ASA: 2  Anesthesia Plan: Epidural   Post-op Pain Management:    Induction:   PONV Risk Score and Plan:   Airway Management Planned:   Additional Equipment:   Intra-op Plan:   Post-operative Plan:   Informed Consent: I have reviewed the patients History and Physical, chart, labs and discussed the procedure including the risks, benefits and alternatives for the proposed anesthesia with the patient or authorized representative who has indicated his/her understanding and acceptance.     Dental advisory given  Plan Discussed with: Anesthesiologist  Anesthesia Plan Comments:         Anesthesia Quick Evaluation
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
## 2021-06-06 ENCOUNTER — Encounter: Payer: Self-pay | Admitting: Obstetrics and Gynecology

## 2021-06-06 LAB — CBC
HCT: 34.1 % — ABNORMAL LOW (ref 36.0–46.0)
Hemoglobin: 11.9 g/dL — ABNORMAL LOW (ref 12.0–15.0)
MCH: 32.2 pg (ref 26.0–34.0)
MCHC: 34.9 g/dL (ref 30.0–36.0)
MCV: 92.4 fL (ref 80.0–100.0)
Platelets: 163 10*3/uL (ref 150–400)
RBC: 3.69 MIL/uL — ABNORMAL LOW (ref 3.87–5.11)
RDW: 13 % (ref 11.5–15.5)
WBC: 16.9 10*3/uL — ABNORMAL HIGH (ref 4.0–10.5)
nRBC: 0 % (ref 0.0–0.2)

## 2021-06-06 LAB — RPR: RPR Ser Ql: NONREACTIVE

## 2021-06-06 MED ORDER — SIMETHICONE 80 MG PO CHEW: 80.0000 mg | CHEWABLE_TABLET | ORAL | Status: DC | PRN

## 2021-06-06 MED ORDER — ONDANSETRON HCL 4 MG/2ML IJ SOLN: 4.0000 mg | INTRAMUSCULAR | Status: DC | PRN

## 2021-06-06 MED ORDER — DIBUCAINE (PERIANAL) 1 % EX OINT
1.0000 "application " | TOPICAL_OINTMENT | CUTANEOUS | Status: DC | PRN
  Administered 2021-06-06: 1 via RECTAL
  Filled 2021-06-06: qty 28

## 2021-06-06 MED ORDER — DIPHENHYDRAMINE HCL 25 MG PO CAPS: 25.0000 mg | ORAL_CAPSULE | Freq: Four times a day (QID) | ORAL | Status: DC | PRN

## 2021-06-06 MED ORDER — IBUPROFEN 600 MG PO TABS
600.0000 mg | ORAL_TABLET | Freq: Four times a day (QID) | ORAL | Status: DC
  Administered 2021-06-06 – 2021-06-07 (×4): 600 mg via ORAL
  Filled 2021-06-06 (×4): qty 1

## 2021-06-06 MED ORDER — SENNOSIDES-DOCUSATE SODIUM 8.6-50 MG PO TABS: 2.0000 | ORAL_TABLET | ORAL | Status: DC

## 2021-06-06 MED ORDER — ZOLPIDEM TARTRATE 5 MG PO TABS: 5.0000 mg | ORAL_TABLET | Freq: Every evening | ORAL | Status: DC | PRN

## 2021-06-06 MED ORDER — BISACODYL 10 MG RE SUPP: 10.0000 mg | Freq: Every day | RECTAL | Status: DC | PRN

## 2021-06-06 MED ORDER — BENZOCAINE-MENTHOL 20-0.5 % EX AERO
1.0000 "application " | INHALATION_SPRAY | CUTANEOUS | Status: DC | PRN
  Administered 2021-06-06: 1 via TOPICAL
  Filled 2021-06-06: qty 56

## 2021-06-06 MED ORDER — PRENATAL MULTIVITAMIN CH
1.0000 | ORAL_TABLET | Freq: Every day | ORAL | Status: DC
Start: 2021-06-06 — End: 2021-06-07
  Administered 2021-06-06: 1 via ORAL

## 2021-06-06 MED ORDER — IBUPROFEN 600 MG PO TABS
600.0000 mg | ORAL_TABLET | Freq: Four times a day (QID) | ORAL | Status: DC
  Administered 2021-06-06: 600 mg via ORAL
  Filled 2021-06-06: qty 1

## 2021-06-06 MED ORDER — SODIUM CHLORIDE 0.9% FLUSH
3.0000 mL | Freq: Two times a day (BID) | INTRAVENOUS | Status: DC
Start: 2021-06-06 — End: 2021-06-07

## 2021-06-06 MED ORDER — SODIUM CHLORIDE 0.9% FLUSH: 3.0000 mL | INTRAVENOUS | Status: DC | PRN

## 2021-06-06 MED ORDER — WITCH HAZEL-GLYCERIN EX PADS
1.0000 "application " | MEDICATED_PAD | CUTANEOUS | Status: DC | PRN
  Administered 2021-06-06: 1 via TOPICAL
  Filled 2021-06-06: qty 100

## 2021-06-06 MED ORDER — FLEET ENEMA 7-19 GM/118ML RE ENEM: 1.0000 | ENEMA | Freq: Every day | RECTAL | Status: DC | PRN

## 2021-06-06 MED ORDER — ACETAMINOPHEN 325 MG PO TABS
650.0000 mg | ORAL_TABLET | ORAL | Status: DC | PRN
  Administered 2021-06-06: 650 mg via ORAL
  Filled 2021-06-06: qty 2

## 2021-06-06 MED ORDER — SODIUM CHLORIDE 0.9 % IV SOLN: 250.0000 mL | INTRAVENOUS | Status: DC | PRN

## 2021-06-06 MED ORDER — ONDANSETRON HCL 4 MG PO TABS: 4.0000 mg | ORAL_TABLET | ORAL | Status: DC | PRN

## 2021-06-06 MED ORDER — COCONUT OIL OIL: 1.0000 "application " | TOPICAL_OIL | Status: DC | PRN

## 2021-06-06 MED ORDER — OXYCODONE HCL 5 MG PO TABS: 5.0000 mg | ORAL_TABLET | ORAL | Status: DC | PRN
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
## 2021-06-06 NOTE — Progress Notes (Signed)
Post Partum Day 0 Subjective: Doing well, no complaints.  Tolerating regular diet, pain with PO meds, voiding and ambulating without difficulty.  No CP SOB Fever,Chills, N/V or leg pain; denies nipple or breast pain, no HA change of vision, RUQ/epigastric pain  Objective: BP 110/70 (BP Location: Right Arm)   Pulse 77   Temp 98.6 F (37 C) (Oral)   Resp 17   Ht 5\' 5"  (1.651 m)   Wt 85.5 kg   LMP 08/31/2020 (Exact Date)   SpO2 98%   Breastfeeding Unknown   BMI 31.35 kg/m    Physical Exam:  General: NAD Breasts: soft/nontender CV: RRR Pulm: nl effort, CTABL Abdomen: soft, NT, BS x 4 Perineum: minimal edema, intact Lochia: small Uterine Fundus: fundus firm and 1 fb below umbilicus DVT Evaluation: no cords, ttp LEs   Recent Labs    06/05/21 1709 06/06/21 0556  HGB 13.4 11.9*  HCT 37.6 34.1*  WBC 8.4 16.9*  PLT 201 163    Assessment/Plan: 40 y.o. 06/08/21 postpartum day # 0  - Continue routine PP care - Lactation consult prn - mild blood loss anemia - hemodynamically stable and asymptomatic - elevated WBC- afebrile, plan to repeat CBC in am.     Disposition: Does not desire Dc home today.     [id number], CNM 06/06/2021  8:35 AM

## 2021-06-07 LAB — CBC
HCT: 34.3 % — ABNORMAL LOW (ref 36.0–46.0)
Hemoglobin: 11.7 g/dL — ABNORMAL LOW (ref 12.0–15.0)
MCH: 32.1 pg (ref 26.0–34.0)
MCHC: 34.1 g/dL (ref 30.0–36.0)
MCV: 94 fL (ref 80.0–100.0)
Platelets: 178 10*3/uL (ref 150–400)
RBC: 3.65 MIL/uL — ABNORMAL LOW (ref 3.87–5.11)
RDW: 13.2 % (ref 11.5–15.5)
WBC: 11.1 10*3/uL — ABNORMAL HIGH (ref 4.0–10.5)
nRBC: 0 % (ref 0.0–0.2)

## 2021-06-07 MED ORDER — IBUPROFEN 600 MG PO TABS: 600.0000 mg | ORAL_TABLET | Freq: Four times a day (QID) | ORAL | 0 refills | Status: AC

## 2021-06-07 MED ORDER — ACETAMINOPHEN 325 MG PO TABS
650.0000 mg | ORAL_TABLET | ORAL | Status: DC | PRN
Start: 2021-06-07 — End: 2024-04-28

## 2021-06-07 MED ORDER — BENZOCAINE-MENTHOL 20-0.5 % EX AERO: 1.0000 "application " | INHALATION_SPRAY | CUTANEOUS | Status: DC | PRN

## 2021-06-07 MED ORDER — IBUPROFEN 600 MG PO TABS: 600.0000 mg | ORAL_TABLET | Freq: Four times a day (QID) | ORAL | Status: DC

## 2021-06-07 NOTE — Anesthesia Postprocedure Evaluation (Signed)
Anesthesia Post Note  Patient: Natasha Mckee  Procedure(s) Performed: AN AD HOC LABOR EPIDURAL  Patient location during evaluation: Mother Baby Anesthesia Type: Epidural Level of consciousness: awake and alert Pain management: pain level controlled Vital Signs Assessment: post-procedure vital signs reviewed and stable Respiratory status: spontaneous breathing, nonlabored ventilation and respiratory function stable Cardiovascular status: stable Postop Assessment: no headache, no backache and epidural receding Anesthetic complications: no   No notable events documented.   Last Vitals:  Vitals:   06/07/21 0100 06/07/21 0753  BP: 116/73 124/78  Pulse: 72 68  Resp: 17 18  Temp: 36.7 C 36.8 C  SpO2: 98% 99%    Last Pain:  Vitals:   06/07/21 0100  TempSrc: Oral  PainSc:                  [name]

## 2021-06-07 NOTE — Discharge Instructions (Signed)
Discharge Instructions:   Follow-up Appointment: Call and make appointment with [facility] Department in 6 weeks for a postpartum visit!   If there are any new medications, they have been ordered and will be available for pickup at the listed pharmacy on your way home from the hospital.   Call office if you have any of the following: headache, visual changes, fever >101.0 F, chills, shortness of breath, breast concerns, excessive vaginal bleeding, incision drainage or problems, leg pain or redness, depression or any other concerns. If you have vaginal discharge with an odor, let your doctor know.   It is normal to bleed for up to 6 weeks. You should not soak through more than 1 pad in 1 hour. If you have a blood clot larger than your fist with continued bleeding, call your doctor.   Activity: Do not lift > 10 lbs for 6 weeks (do not lift anything heavier than your baby). No intercourse, tampons, swimming pools, hot tubs, baths (only showers) for 6 weeks.  No driving for 1-2 weeks. Continue prenatal vitamin, especially if breastfeeding. Increase calories and fluids (water) while breastfeeding.   Your milk will come in, in the next couple of days (right now it is colostrum). You may have a slight fever when your milk comes in, but it should go away on its own.  If it does not, and rises above 101 F please call the doctor. You will also feel achy and your breasts will be firm. They will also start to leak. If you are breastfeeding, continue as you have been and you can pump/express milk for comfort.   If you have too much milk, your breasts can become engorged, which could lead to mastitis. This is an infection of the milk ducts. It can be very painful and you will need to notify your doctor to obtain a prescription for antibiotics. You can also treat it with a shower or hot/cold compress.   For concerns about your baby, please call your pediatrician.  For breastfeeding concerns, the  lactation consultant can be reached at [phone].   Postpartum blues (feelings of happy one minute and sad another minute) are normal for the first few weeks but if it gets worse let your doctor know.   Congratulations! We enjoyed caring for you and your new bundle of joy!

## 2021-06-07 NOTE — Progress Notes (Signed)
Patient discharged home with infant. FOB and family members present at discharge. Discharge instructions and prescriptions given and reviewed with patient. Patient verbalized understanding.   Encouraged pt to call and schedule follow-up appointment at [facility] Department in 6-weeks!   Escorted out by volunteers.

## 2021-06-29 LAB — URINALYSIS
Bilirubin, UA: NEGATIVE
Glucose, UA: NEGATIVE
Ketones, UA: NEGATIVE
Leukocytes,UA: NEGATIVE
Nitrite, UA: NEGATIVE
Specific Gravity, UA: 1.03 (ref 1.005–1.030)
Urobilinogen, Ur: 2 mg/dL — ABNORMAL HIGH (ref 0.2–1.0)
pH, UA: 7 (ref 5.0–7.5)

## 2021-08-17 ENCOUNTER — Ambulatory Visit: Payer: Self-pay | Admitting: Nurse Practitioner

## 2021-08-17 ENCOUNTER — Encounter: Payer: Self-pay | Admitting: Physician Assistant

## 2021-08-17 ENCOUNTER — Other Ambulatory Visit: Payer: Self-pay

## 2021-08-17 VITALS — BP 137/78 | Ht 65.0 in | Wt 172.4 lb

## 2021-08-17 DIAGNOSIS — Z30013 Encounter for initial prescription of injectable contraceptive: Secondary | ICD-10-CM

## 2021-08-17 DIAGNOSIS — R809 Proteinuria, unspecified: Secondary | ICD-10-CM

## 2021-08-17 DIAGNOSIS — Z3009 Encounter for other general counseling and advice on contraception: Secondary | ICD-10-CM

## 2021-08-17 MED ORDER — MEDROXYPROGESTERONE ACETATE 150 MG/ML IM SUSP
150.0000 mg | INTRAMUSCULAR | Status: AC
  Administered 2021-08-17: 18:00:00 150 mg via INTRAMUSCULAR

## 2021-08-17 NOTE — Progress Notes (Signed)
Depo given today and next Depo card given.Marland KitchenMarland KitchenBurt Knack, RN

## 2021-08-17 NOTE — Progress Notes (Signed)
Patient here for PP visit. Had SVD on 06/06/2021 at 39 6/7. Desires Depo for BCM.Marland KitchenBurt Knack, RN

## 2021-08-17 NOTE — Progress Notes (Signed)
Singer Partum Exam  Natasha Mckee is a 40 y.o. [id number] female who presents for a postpartum visit. She is 10 week postpartum following a spontaneous vaginal delivery. I have fully reviewed the prenatal and intrapartum course. The delivery was at [redacted]w[redacted]d gestational weeks.  Anesthesia: epidural. Postpartum course has been good. Baby's course has been good. Baby is feeding by Bottle Bleeding no bleeding. Bowel function ok with some constipation. Patient states she has a history of constipation prior to pregnancy.  Bladder function is normal. Patient is sexually active. Desired contraception method is Hormonal Injection   Postpartum depression screening:  Edinburgh Postnatal Depression Scale - 08/17/21 1001       Edinburgh Postnatal Depression Scale:  In the Past 7 Days   I have been able to laugh and see the funny side of things. 0    I have looked forward with enjoyment to things. 0    I have blamed myself unnecessarily when things went wrong. 1    I have been anxious or worried for no good reason. 1    I have felt scared or panicky for no good reason. 0    Things have been getting on top of me. 1    I have been so unhappy that I have had difficulty sleeping. 0    I have felt sad or miserable. 0    I have been so unhappy that I have been crying. 0    The thought of harming myself has occurred to me. 0    Edinburgh Postnatal Depression Scale Total 3              The following portions of the patient's history were reviewed and updated as appropriate: allergies, current medications, past family history, past medical history, past social history, past surgical history, and problem list. Last pap smear done 11/22/2020 and was Normal  Review of Systems A comprehensive review of systems was negative.    Objective:  BP 137/78    Ht 5\' 5"  (1.651 m)    Wt 172 lb 6.4 oz (78.2 kg)    LMP 08/06/2021 (Exact Date)    Breastfeeding No    BMI 28.69 kg/m    Gen: well appearing, NAD HEENT: no scleral icterus CV: RR Lung: Normal WOB Breast:performed-yes  Ext: warm well perfused  GU: WNL Rectal: performed -  yes       Assessment:    10w postpartum exam. Pap smear not done at today's visit.   Plan:   Essential components of care per ACOG recommendations for Comprehensive Postpartum exam:  1.  Mood and well being: Patient with negative depression screening today. Reviewed local resources for support. EPDS is low risk. Reviewed resources and that mood sx in first year after pregnancy are considered related to pregnancy and to reach out for help at [facility] if needed. Discussed [facility] as link to care and availability of LCSW for counseling  - Patient does not use tobacco.  - hx of drug use? No   2. Infant care and feeding:  -Patient currently breastmilk feeding? No, due to not enough milk supply -Social determinants of health (SDOH) reviewed in EPIC. No concerns noted.   3. Sexuality, contraception and birth spacing  Contraception: Contraception counseling: Reviewed all forms of birth control options in the tiered based approach. available including abstinence; over the counter/barrier methods; hormonal contraceptive medication including pill, patch, ring, injection,contraceptive implant; hormonal and nonhormonal IUDs; permanent sterilization options including vasectomy and  the various tubal sterilization modalities. Risks, benefits, and typical effectiveness rates were reviewed.  Questions were answered.  Written information was also given to the patient to review.  Patient desires Depo, this was prescribed for patient. She will follow up in 1 year for surveillance.  She was told to call with any further questions, or with any concerns about this method of contraception.  Emphasized use of condoms 100% of the time for STI prevention.  Patient was not offered ECP. Last sex was over a week ago with a condom.  Last LMP 08/06/21.   - Patient does not  want a pregnancy in the next year.  Desired family size is 8 children.  - Reviewed forms of contraception in tiered fashion. Patient desired Depo-Provera today.   - Discussed birth spacing of 18 months  4. Sleep and fatigue -Encouraged family/partner/community support of 4 hrs of uninterrupted sleep to help with mood and fatigue  5. Physical Recovery  - Discussed patients delivery and complications.  Patient voiced no problems and complications.  - Patient had no laceration, perineal healing reviewed. Patient expressed understanding. - Patient has urinary incontinence? No - Patient is safe to resume physical and sexual activity  6.  Health Maintenance/Chronic Disease Health Maintenance Due  Topic Date Due   COVID-19 Vaccine (1) Never done   HIV Screening  Never done    - Last pap smear performed 11/22/20 and was normal with negative HPV. -Mammogram- 12/11/2019  Patient given handout about PCP care in the community Given MVI per family planning program guidelines and availability  7. Family planning - 40 year old female seen today for a postpartum visit ([redacted]w[redacted]d).   -No problems noted. -Declines STD screening today.   8. Encounter for prescription for depo-Provera -Patient desires depo today.  May have Depo 150 mg IM q 11-13 weeks x 1 year.   - medroxyPROGESTERone (DEPO-PROVERA) injection 150 mg  9. Proteinuria unspecified type -Patient had proteinuria during pregnancy.  Protein/creatinine ratio collected at delivery.  Reading 1.46.  Will collect protein/creatinine ration today. Will notify of results.  - Protein / creatinine ratio, urine     Follow up in: 1  year  or as needed.

## 2021-08-18 ENCOUNTER — Encounter: Payer: Self-pay | Admitting: Nurse Practitioner

## 2021-08-18 LAB — PROTEIN / CREATININE RATIO, URINE
Creatinine, Urine: 29.8 mg/dL
Protein, Ur: 5.1 mg/dL
Protein/Creat Ratio: 171 mg/g creat (ref 0–200)

## 2022-01-20 IMAGING — MG DIGITAL SCREENING BILAT W/ TOMO W/ CAD
6 of 10 series · 6 of 30 positions shown · non-contrast
Comparison: None.

CLINICAL DATA: Screening.

EXAM:
DIGITAL SCREENING BILATERAL MAMMOGRAM WITH TOMO AND CAD

[L CC synth-2D]
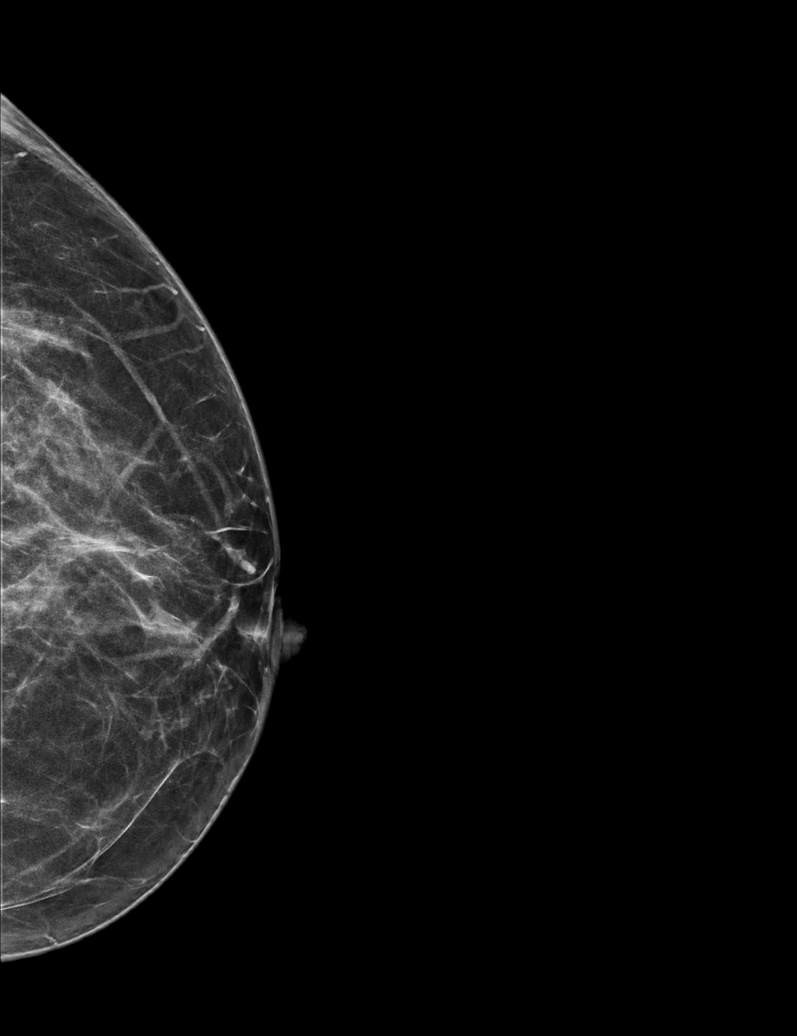

[L MLO synth-2D (1 of 2)]
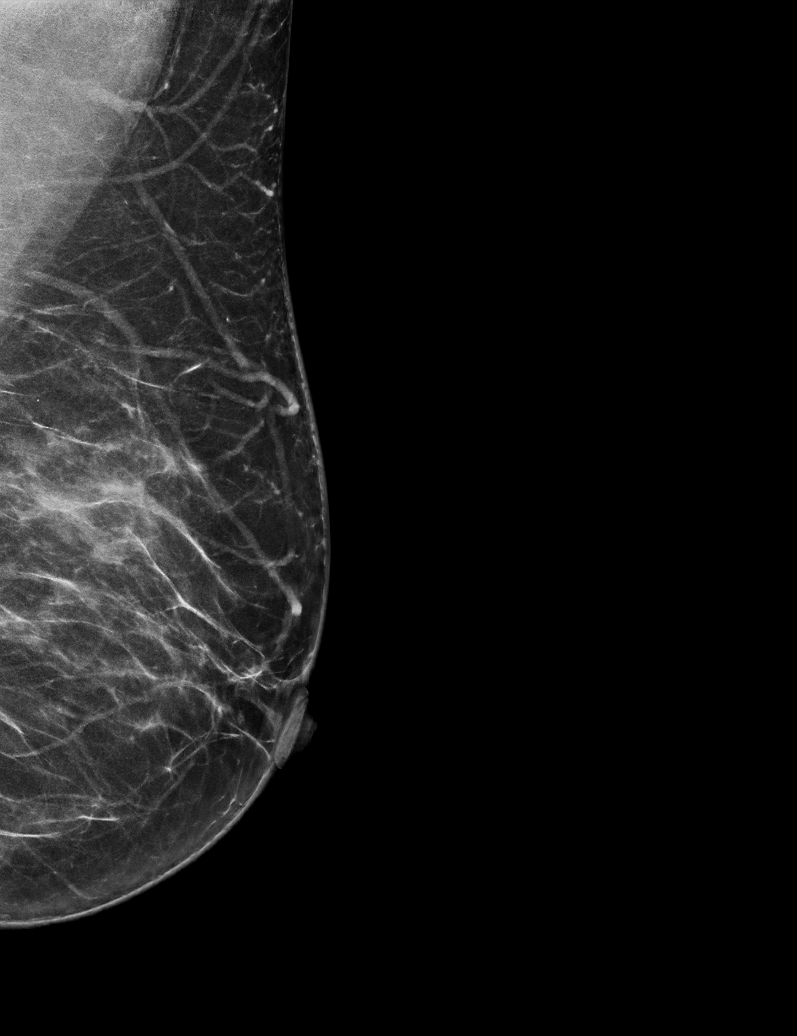

[R CC synth-2D]
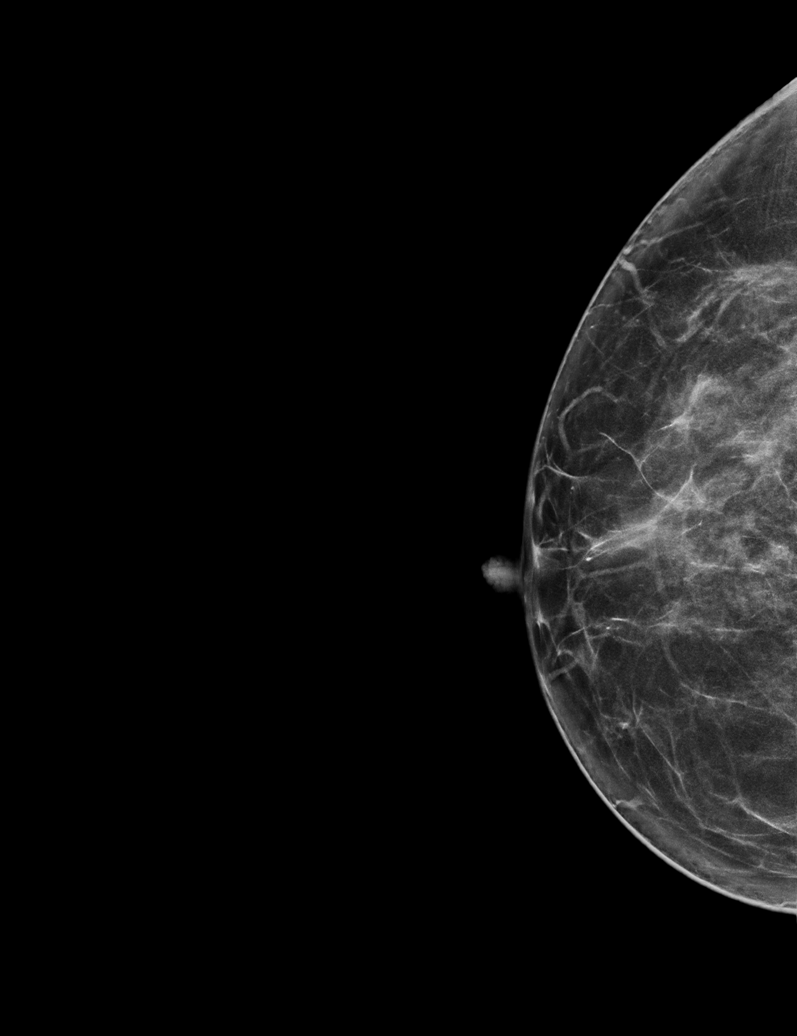

[L MLO synth-2D (2 of 2)]
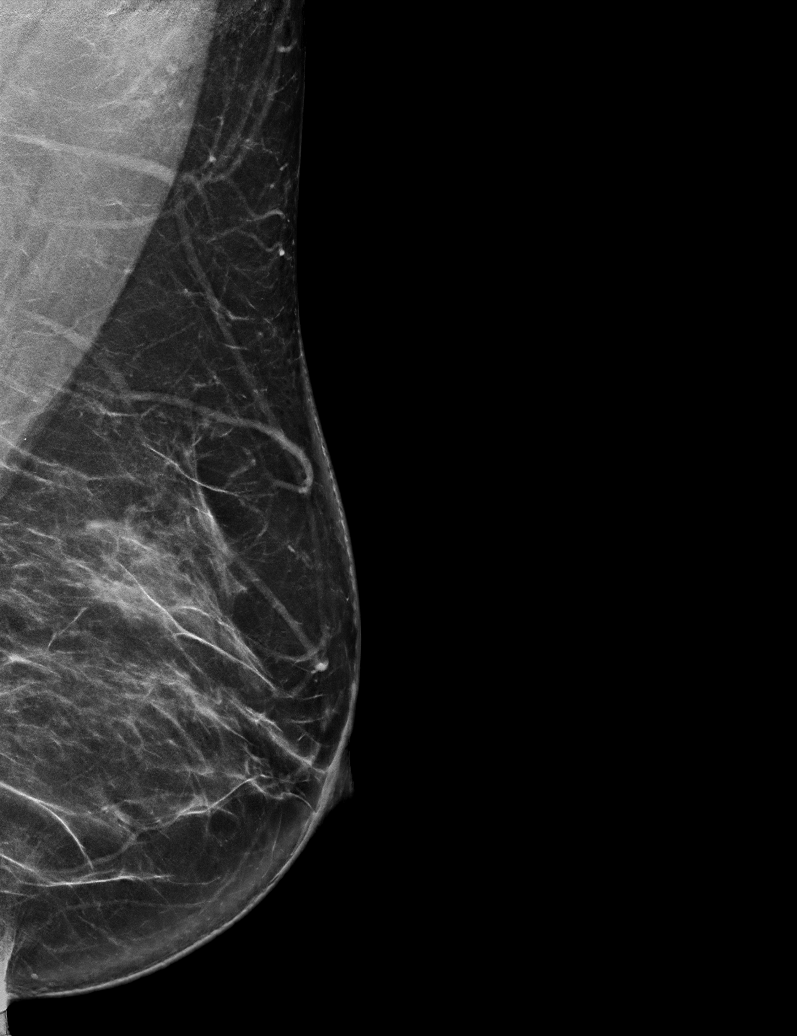

[R MLO synth-2D]
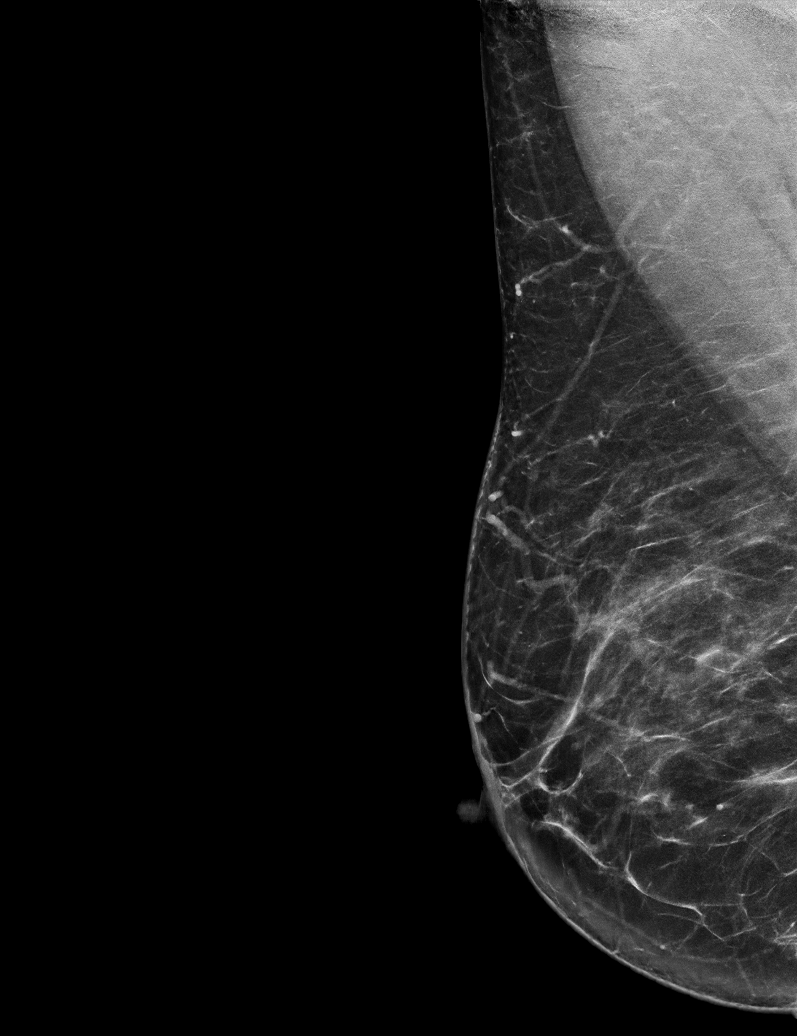

[L CC tomo · tomo slice 29/56.0]
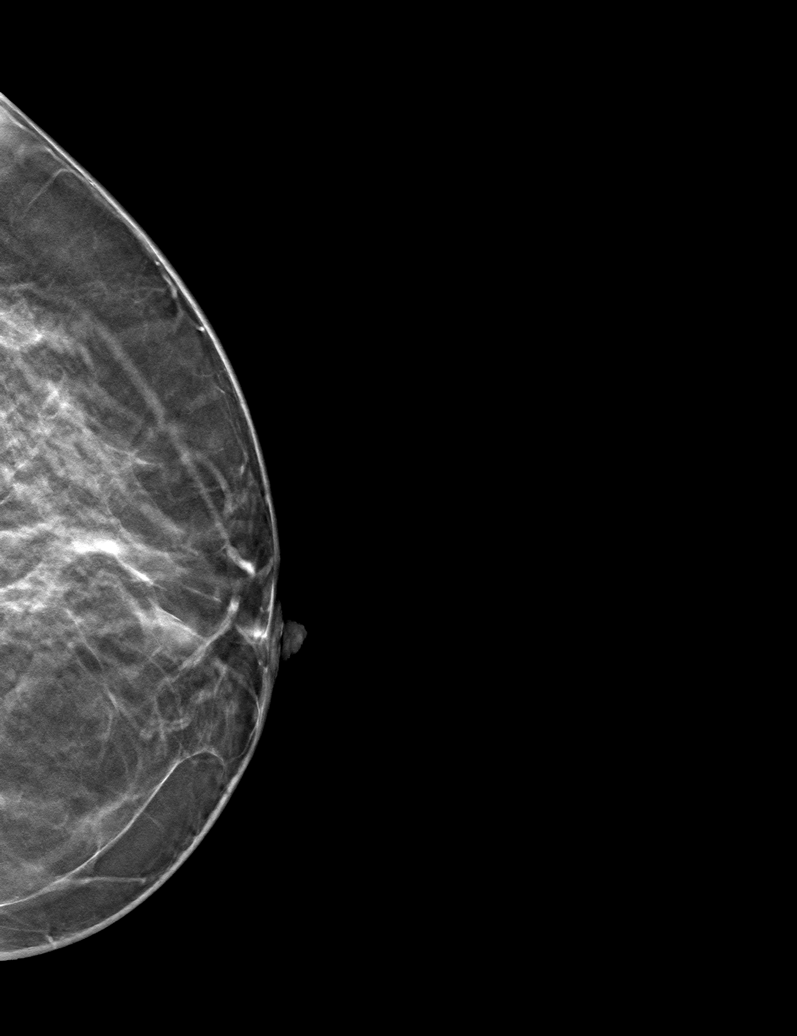

[6 of 30 positions shown; findings below may reference images not displayed]

ACR Breast Density Category c: The breast tissue is heterogeneously
dense, which may obscure small masses.
FINDINGS: In the left breast, a possible asymmetry warrants further
evaluation. In the right breast, no findings suspicious for
malignancy. Images were processed with CAD.
IMPRESSION: Further evaluation is suggested for possible asymmetry in the left
breast.

RECOMMENDATION:
Diagnostic mammogram and possibly ultrasound of the left breast.
(Code:[id number])

The patient will be contacted regarding the findings, and additional
imaging will be scheduled.

BI-RADS CATEGORY  0: Incomplete. Need additional imaging evaluation
and/or prior mammograms for comparison.

## 2022-08-29 ENCOUNTER — Other Ambulatory Visit: Payer: Self-pay

## 2022-08-29 DIAGNOSIS — N6489 Other specified disorders of breast: Secondary | ICD-10-CM

## 2022-08-30 ENCOUNTER — Encounter: Payer: Self-pay | Admitting: Student

## 2022-09-14 ENCOUNTER — Ambulatory Visit: Payer: Self-pay

## 2022-09-18 ENCOUNTER — Ambulatory Visit: Payer: Self-pay | Attending: Hematology and Oncology | Admitting: Hematology and Oncology

## 2022-09-18 ENCOUNTER — Ambulatory Visit
Admission: RE | Admit: 2022-09-18 | Discharge: 2022-09-18 | Disposition: A | Discharge status: Home / Self Care | Payer: Self-pay | Source: Ambulatory Visit | Attending: Obstetrics and Gynecology | Admitting: Obstetrics and Gynecology

## 2022-09-18 VITALS — BP 124/82 | Wt 176.4 lb

## 2022-09-18 DIAGNOSIS — N6489 Other specified disorders of breast: Secondary | ICD-10-CM

## 2022-09-18 NOTE — Progress Notes (Signed)
[name] is a 42 y.o. female who presents to [facility] clinic today with no complaints.    Pap Smear: Pap not smear completed today. Last Pap smear was 2022 at [facility] clinic and was normal. Per patient has no history of an abnormal Pap smear. Last Pap smear result is available in Epic.   Physical exam: Breasts Breasts symmetrical. No skin abnormalities bilateral breasts. No nipple retraction bilateral breasts. No nipple discharge bilateral breasts. No lymphadenopathy. No lumps palpated bilateral breasts.   MS DIGITAL DIAG TOMO UNI LEFT  Result Date: 12/11/2019 CLINICAL DATA:  Patient was called back from screening mammogram for possible asymmetry in the left breast. EXAM: DIGITAL DIAGNOSTIC LEFT MAMMOGRAM WITH CAD AND TOMO ULTRASOUND LEFT BREAST COMPARISON:  Baseline screening mammogram dated 11/18/2019. ACR Breast Density Category c: The breast tissue is heterogeneously dense, which may obscure small masses. FINDINGS: Additional imaging of the left breast was performed. The asymmetry in the superior aspect of the breast is less apparent on the additional images. There is no suspicious mass or malignant type microcalcifications. Mammographic images were processed with CAD. Targeted ultrasound is performed, showing normal tissue in the superior aspect of the left breast. No solid or cystic mass, abnormal shadowing or distortion visualized. IMPRESSION: Probable benign asymmetric fibroglandular tissue in the superior aspect of the left breast. RECOMMENDATION: Short-term interval follow-up left mammogram in 6 months is recommended. I have discussed the findings and recommendations with the patient. If applicable, a reminder letter will be sent to the patient regarding the next appointment. BI-RADS CATEGORY  3: Probably benign. Electronically Signed   By: Lillia Mountain M.D.   On: 12/11/2019 10:04   MS DIGITAL SCREENING TOMO BILATERAL  Result Date: 11/18/2019 CLINICAL DATA:  Screening. EXAM:  DIGITAL SCREENING BILATERAL MAMMOGRAM WITH TOMO AND CAD COMPARISON:  None. ACR Breast Density Category c: The breast tissue is heterogeneously dense, which may obscure small masses. FINDINGS: In the left breast, a possible asymmetry warrants further evaluation. In the right breast, no findings suspicious for malignancy. Images were processed with CAD. IMPRESSION: Further evaluation is suggested for possible asymmetry in the left breast. RECOMMENDATION: Diagnostic mammogram and possibly ultrasound of the left breast. (Code:[id number]) The patient will be contacted regarding the findings, and additional imaging will be scheduled. BI-RADS CATEGORY  0: Incomplete. Need additional imaging evaluation and/or prior mammograms for comparison. Electronically Signed   By: Ammie Ferrier M.D.   On: 11/18/2019 10:21        Pelvic/Bimanual Pap is not indicated today    Smoking History: Patient has never smoked and was not referred to quit line.    Patient Navigation: Patient education provided. Access to services provided for patient through [facility] program. [employer] [phone]) interpreter provided. No transportation provided   Colorectal Cancer Screening: Per patient has never had colonoscopy completed No complaints today.    Breast and Cervical Cancer Risk Assessment: Patient has family history of breast cancer, maternal aunt. Patient does not have history of cervical dysplasia, immunocompromised, or DES exposure in-utero.  Risk Assessment   No risk assessment data for the current encounter  Risk Scores       11/18/2019   Last edited by: Theodore Demark, RN   5-year risk: 0.2 %   Lifetime risk: 4.7 %            A: [facility] exam without pap smear No complaints with benign exam.   P: Referred patient to the [place] for a screening mammogram. Appointment scheduled 09/18/22.  Dayton Scrape A, NP 09/18/2022 9:22 AM

## 2022-09-18 NOTE — Patient Instructions (Signed)
[place] about breast self awareness and gave educational materials to take home. Patient did not need a Pap smear today due to last Pap smear was in 2022 per patient. Let her know [facility] will cover Pap smears every 5 years unless has a history of abnormal Pap smears. [facility]. Appointment scheduled for 09/18/2022. Patient aware of appointment and will be there. Let patient know will follow up with her within the next couple weeks with results. [place] Garcia-Perez verbalized understanding.  Melodye Ped, NP 9:25 AM

## 2023-02-10 IMAGING — US US MFM OB COMP +14 WKS
1 series · 14 of 28 positions shown · non-contrast
Comparison: none

[Series 1: us mfm ob comp +14 wks · 14 of 42 slices shown]
[im 2/42]
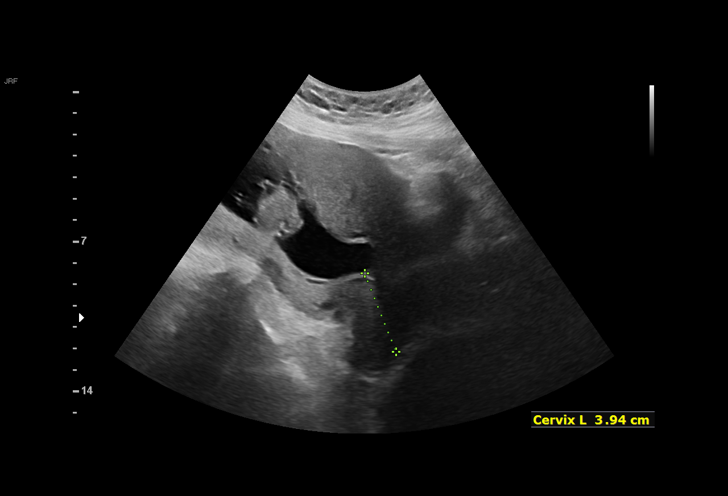
[im 5/42]
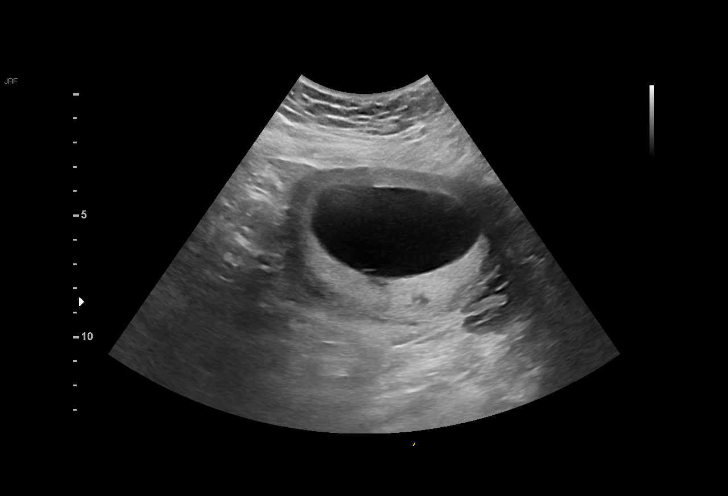
[im 8/42]
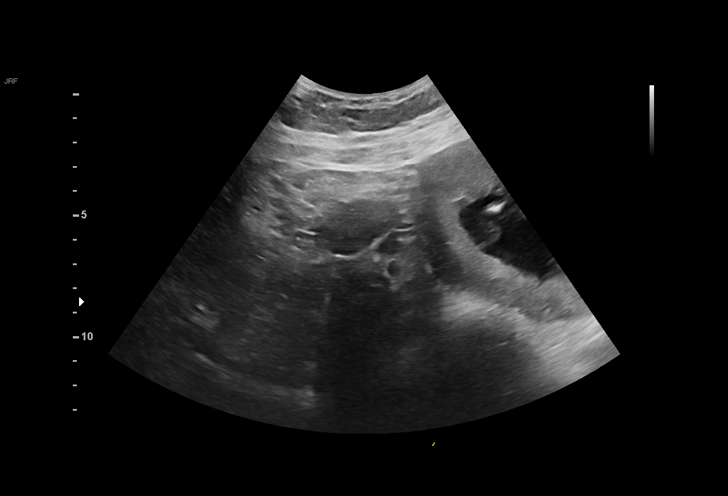
[im 11/42]
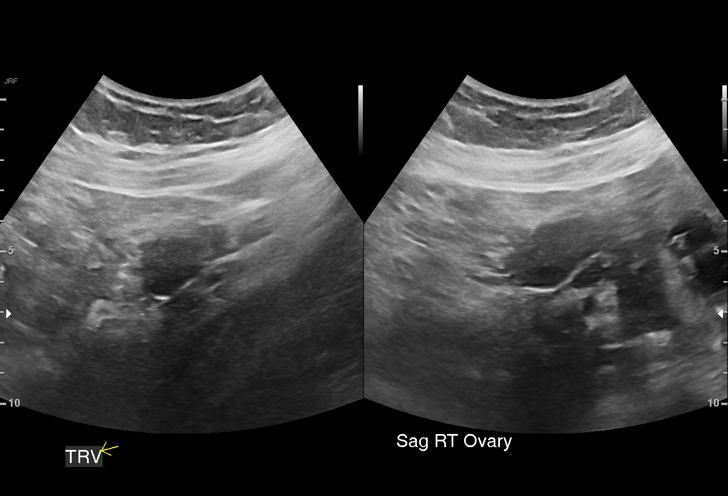
[im 14/42]
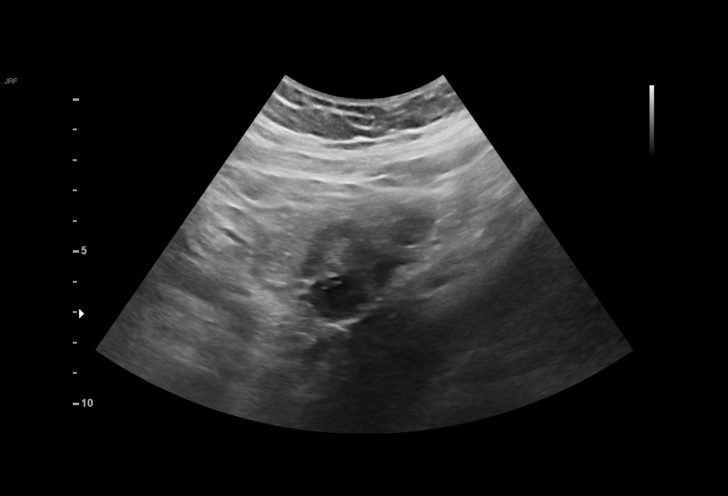
[im 17/42]
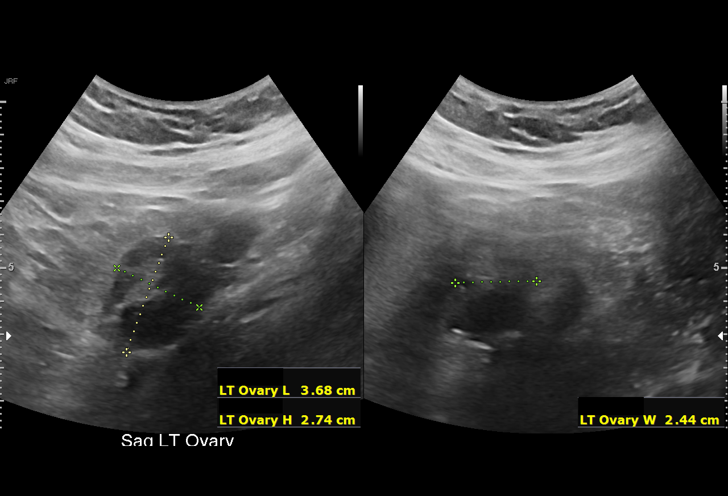
[im 20/42]
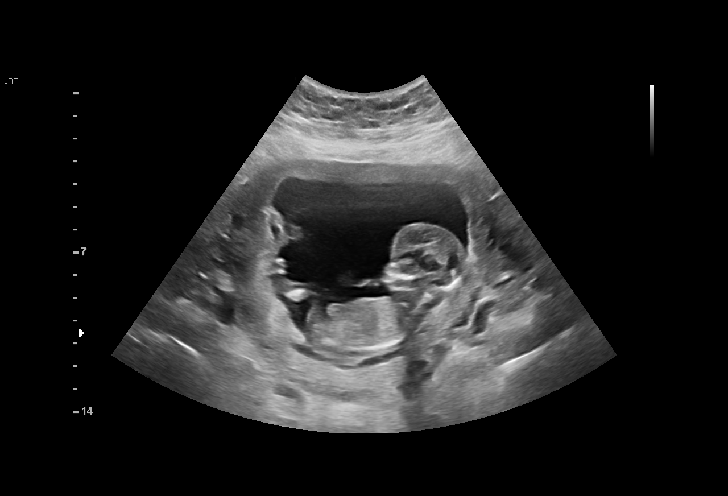
[im 23/42]
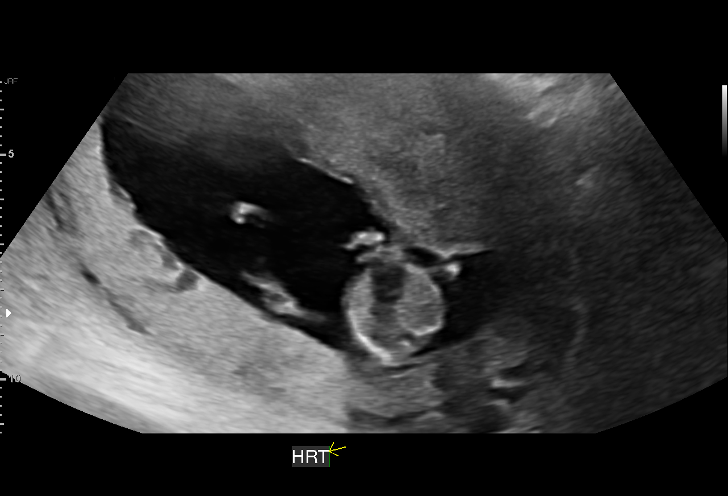
[im 26/42]
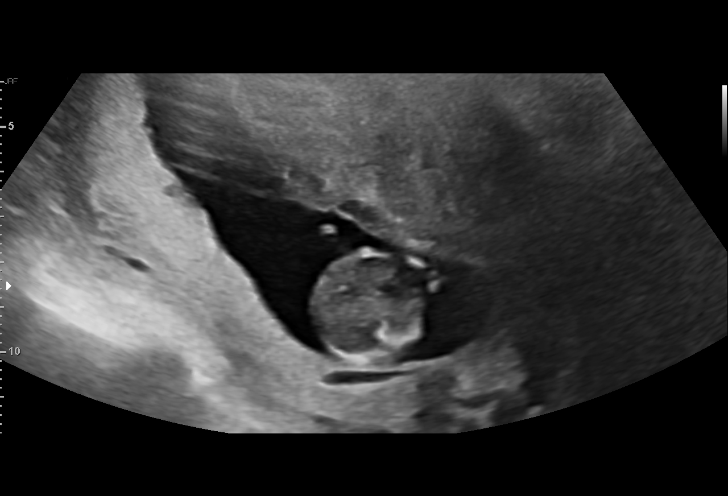
[im 29/42]
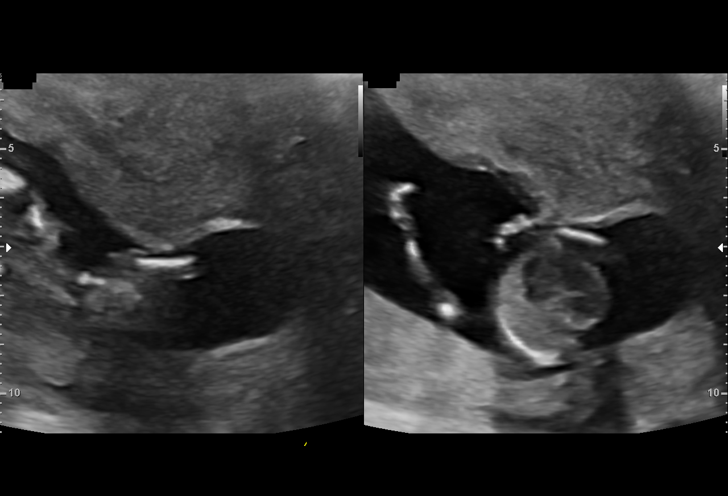
[im 32/42]
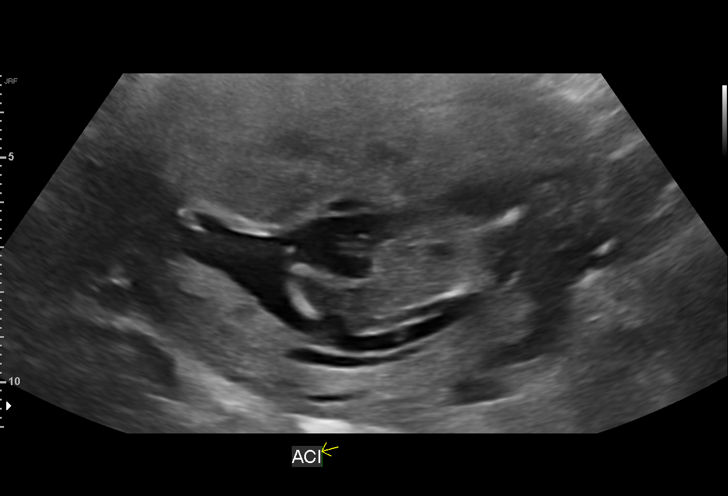
[im 35/42]
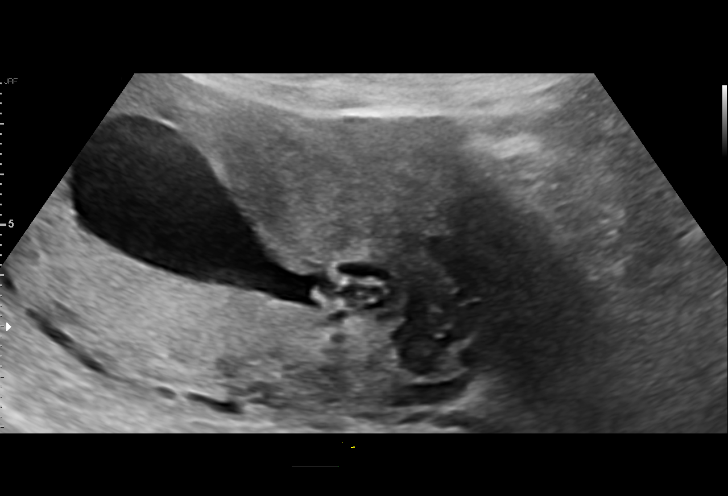
[im 38/42]
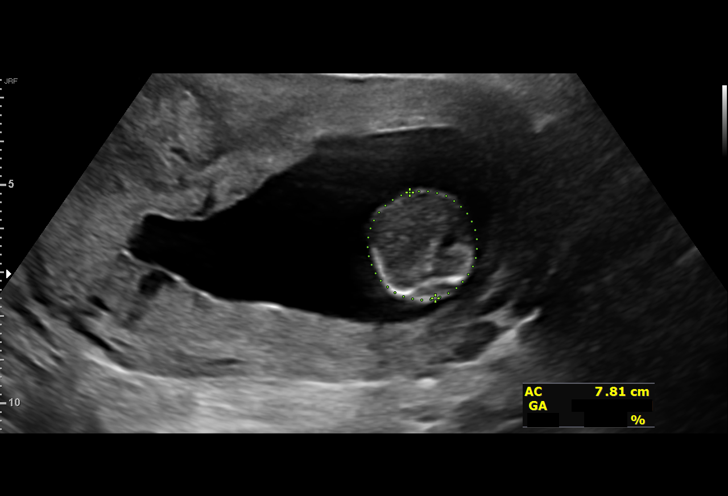
[im 42/42]
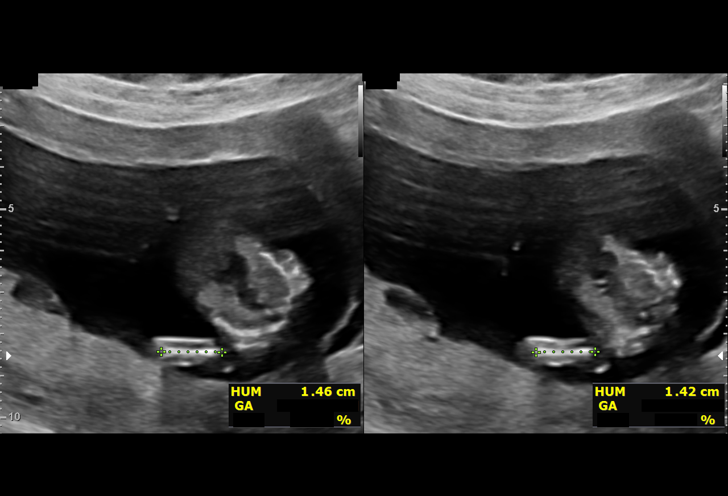

[14 of 28 positions shown; findings below may reference images not displayed]

DESMOND

                   SHANELL CNM

 1  US MFM OB COMP + 14 WK                76805.01    ARSLAN PRESCOTT

Indications

 14 weeks gestation of pregnancy
 Advanced maternal age multigravida 35+,
 first trimester
 Obesity complicating pregnancy, first
 trimester
 History of premature delivery
Fetal Evaluation

 Num Of Fetuses:          1
 Fetal Heart Rate(bpm):   157
 Cardiac Activity:        Observed
 Presentation:            Variable
 Placenta:                Posterior
Biometry

 CRL:      80.3  mm     G. Age:  13w 5d                  EDD:    06/10/21

 BPD:      25.9  mm     G. Age:  14w 4d                  CI:        69.58   %    70 - 86
                                                         FL/HC:       12.8  %
 HC:       99.1  mm     G. Age:  14w 4d                  HC/AC:       1.25       1.14 -
 AC:         79  mm     G. Age:  14w 2d                  FL/BPD:      49.0  %
 FL:       12.7  mm     G. Age:  13w 5d                  FL/AC:       16.1  %    20 - 24
 HUM:      14.4  mm     G. Age:  14w 0d

 Est. FW:      90   gm     0 lb 3 oz
OB History

 Gravidity:    10
 Living:       7
Gestational Age

 LMP:           14w 1d        Date:  08/31/20                 EDD:   06/07/21
 U/S Today:     14w 2d                                        EDD:   06/06/21
 Best:          14w 1d     Det. By:  LMP  (08/31/20)          EDD:   06/07/21
Anatomy

 Cranium:               Appears normal         Aortic Arch:            Not well visualized
 Cavum:                 Not well visualized    Ductal Arch:            Not well visualized
 Ventricles:            Not well visualized    Diaphragm:              Not well visualized
 Choroid Plexus:        Appears normal         Stomach:                Appears normal, left
                                                                       sided
 Cerebellum:            Not well visualized    Abdomen:                Appears normal
 Posterior Fossa:       Not well visualized    Abdominal Wall:         Appears nml (cord
                                                                       insert, abd wall)
 Nuchal Fold:           Not well visualized    Cord Vessels:           Not well visualized
 Face:                  Not well visualized    Kidneys:                Not well visualized
 Lips:                  Not well visualized    Bladder:                Appears normal
 Thoracic:              Appears normal         Spine:                  Not well visualized
 Heart:                 Not well visualized    Upper Extremities:      Not well visualized
 RVOT:                  Not well visualized    Lower Extremities:      Not well visualized
 LVOT:                  Not well visualized
Cervix Uterus Adnexa

 Cervix
 Length:           3.94  cm.
Impression

 Single intrauterine pregnancy here for dating and genetic
 counseling
 Her pregnancy history is complicated by advanced maternal
 age of 39 yo

 Today's measurements are consistent with dates.

 The early second  trimester anatomy was normal, however, a
 detailed anatomy was precluded due to early gestational age.

 Therefore we have scheduled Ms.Yungvar is scheduled to to
 return in  4-6 weeks for an anatomical survey.

 She also opted to meet with our genetic counselor please see
 her report for details.
 In addition we discussed starting daily low dose ASA for the
 prevention of preeclampsi
Recommendations

 Detailed exam at 18-20 weeks.

## 2023-03-10 IMAGING — US US MFM OB DETAIL+14 WK
1 series · 13 of 28 positions shown · non-contrast
Comparison: none

[Series 1: us mfm ob detail+14 wk · 13 of 89 slices shown]
[im 4/89]
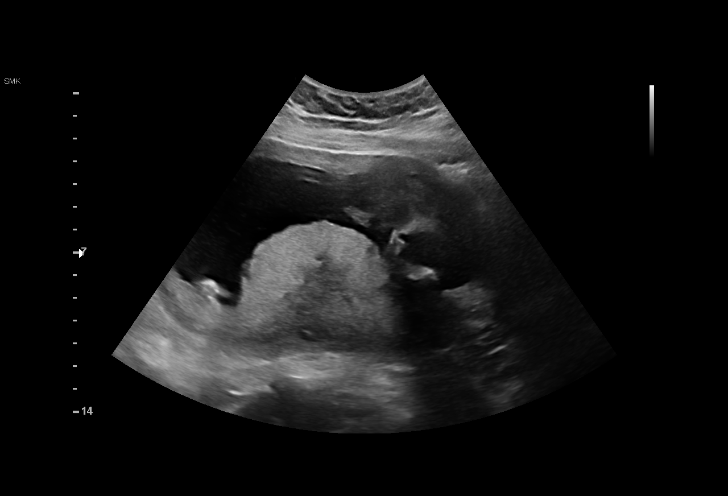
[im 10/89]
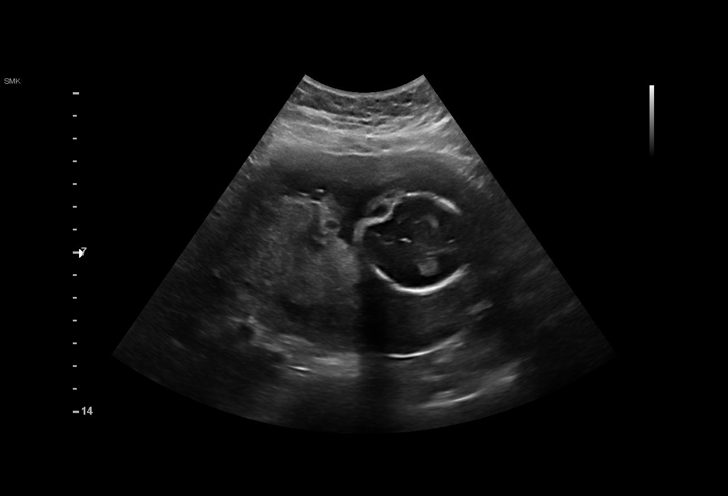
[im 17/89]
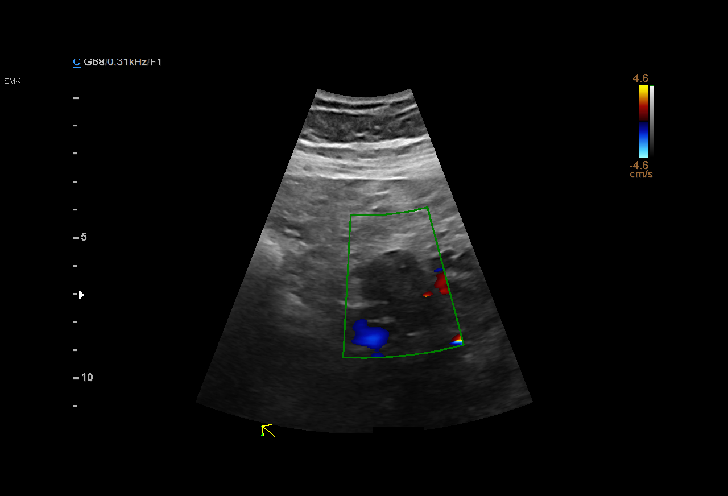
[im 23/89]
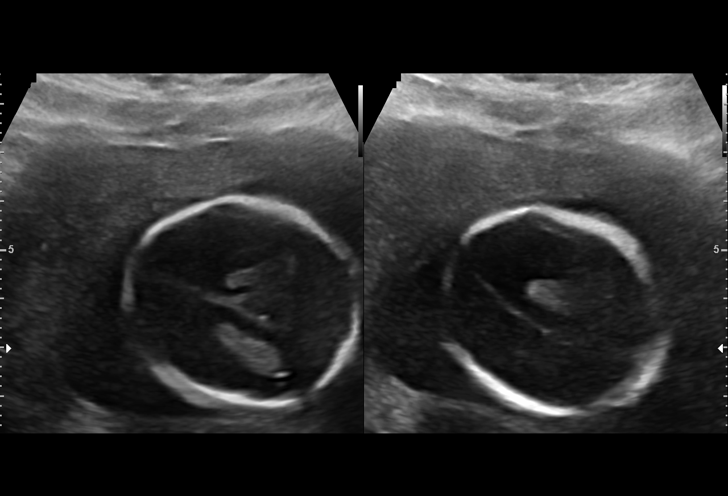
[im 30/89]
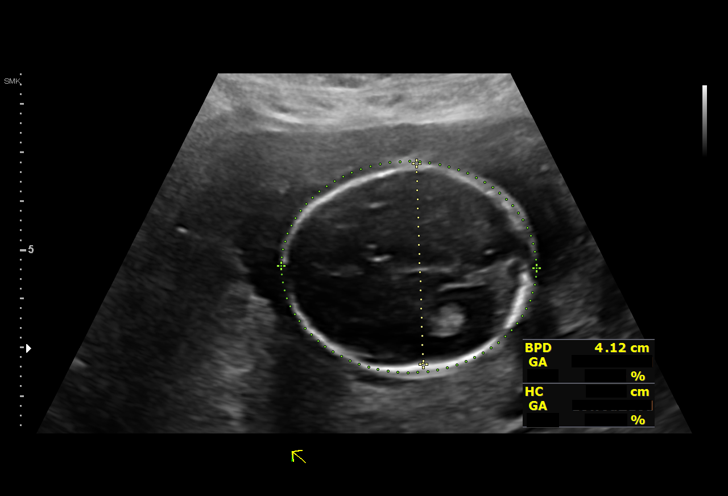
[im 36/89]
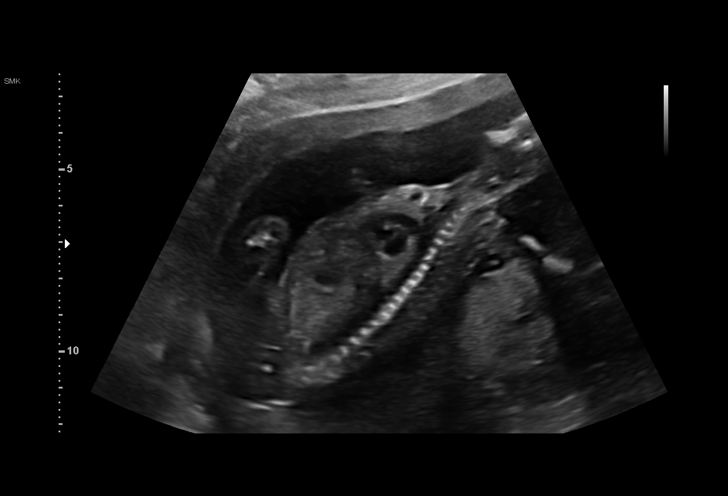
[im 46/89]
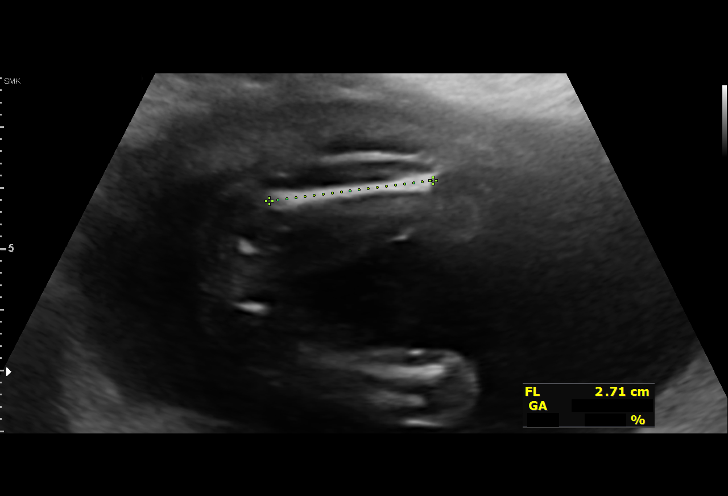
[im 53/89]
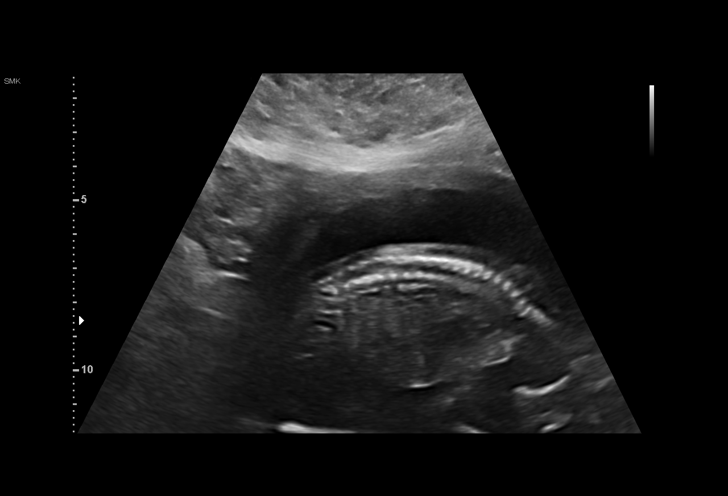
[im 59/89]
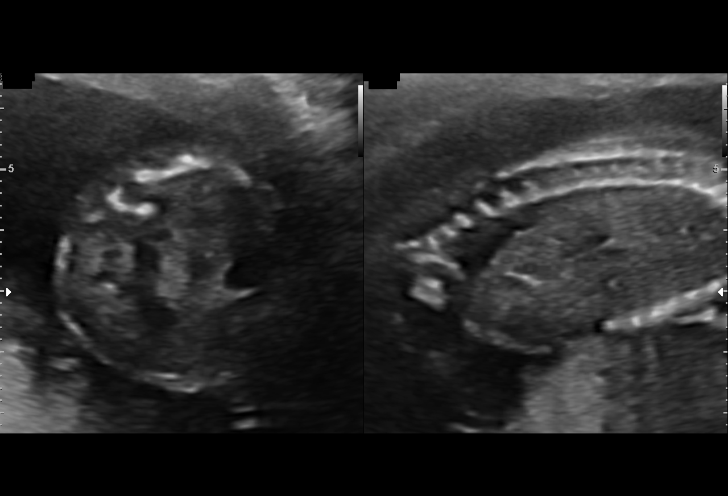
[im 66/89]
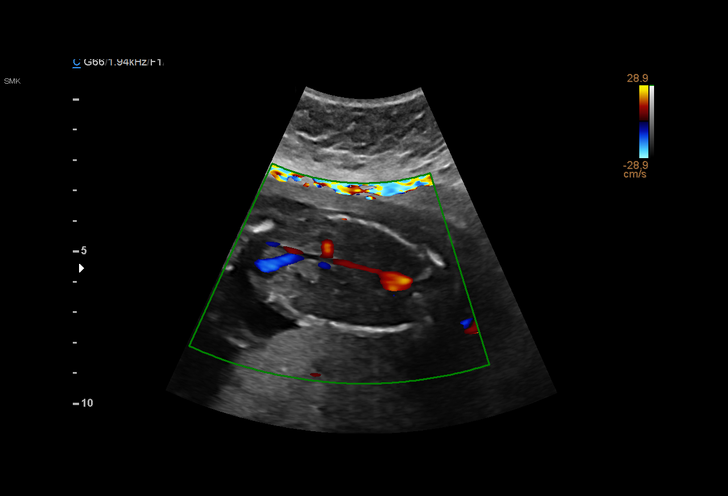
[im 72/89]
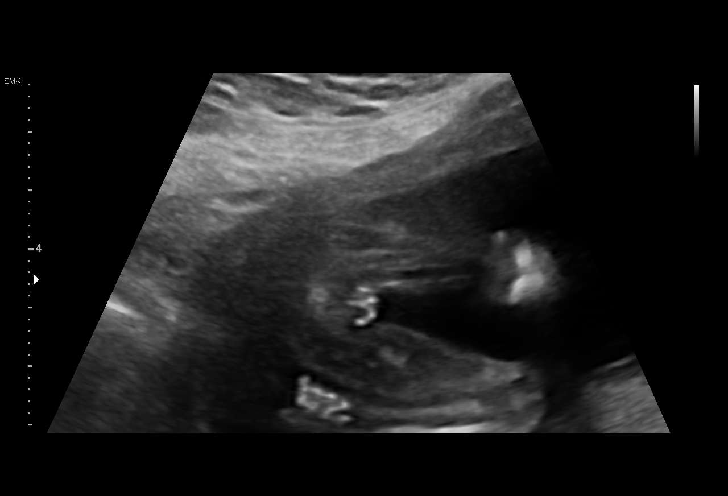
[im 79/89]
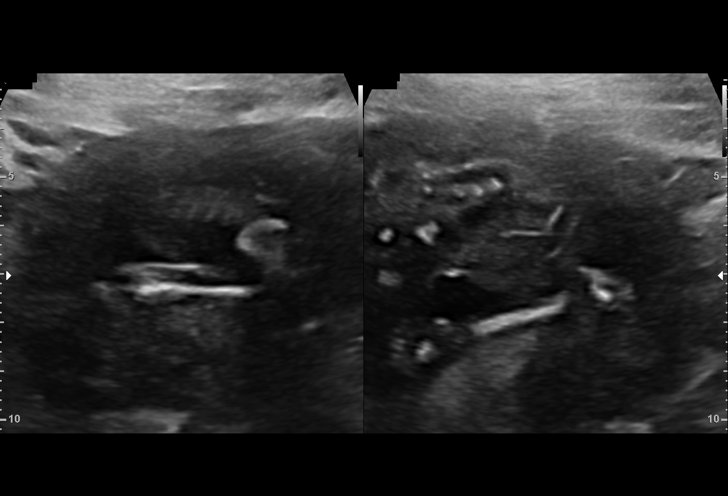
[im 85/89]
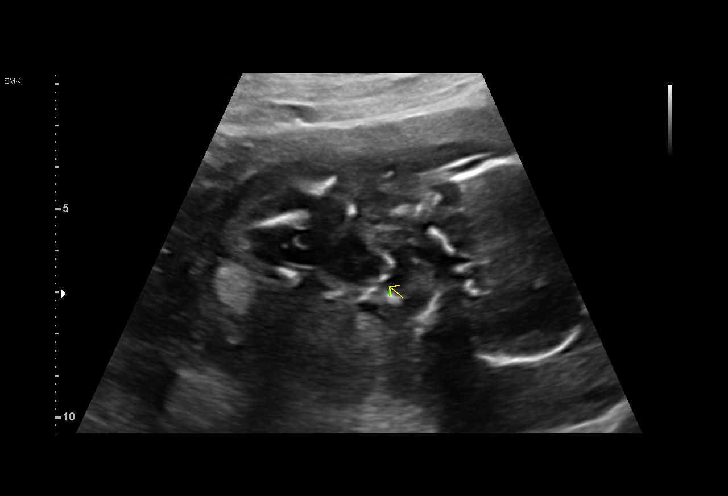

[13 of 28 positions shown; findings below may reference images not displayed]

GUTH

                   ARBOVA CNM

Indications

 18 weeks gestation of pregnancy
 Advanced maternal age multigravida 35+,
 second trimester
 Obesity complicating pregnancy, second
 trimester
 History of premature delivery
Fetal Evaluation

 Num Of Fetuses:         1
 Fetal Heart Rate(bpm):  148
 Cardiac Activity:       Observed
 Presentation:           Variable
 Placenta:               Posterior
 P. Cord Insertion:      Visualized, central

                             Largest Pocket(cm)

Biometry

 BPD:      40.9  mm     G. Age:  18w 3d         63  %    CI:        73.65   %    70 - 86
                                                         FL/HC:      17.8   %    15.8 - 18
 HC:      151.4  mm     G. Age:  18w 1d         44  %    HC/AC:      1.15        1.07 -
 AC:      131.9  mm     G. Age:  18w 5d         66  %    FL/BPD:     66.0   %
 FL:         27  mm     G. Age:  18w 2d         48  %    FL/AC:      20.5   %    20 - 24
 HUM:        26  mm     G. Age:  18w 1d         56  %
 CER:        16  mm     G. Age:  16w 4d      < 2.3  %
 NFT:       2.9  mm
 LV:        5.8  mm
 CM:        3.5  mm

 Est. FW:     240  gm      0 lb 8 oz     64  %
OB History

 Gravidity:    10
 Living:       7
Gestational Age

 LMP:           18w 1d        Date:  08/31/20                 EDD:   06/07/21
 U/S Today:     18w 3d                                        EDD:   06/05/21
 Best:          18w 1d     Det. By:  LMP  (08/31/20)          EDD:   06/07/21
Anatomy

 Cranium:               Appears normal         Aortic Arch:            Appears normal
 Cavum:                 Appears normal         Ductal Arch:            Appears normal
 Ventricles:            Appears normal         Diaphragm:              Appears normal
 Choroid Plexus:        Appears normal         Stomach:                Appears normal, left
                                                                       sided
 Cerebellum:            Appears normal         Abdomen:                Appears normal
 Posterior Fossa:       Appears normal         Abdominal Wall:         Appears nml (cord
                                                                       insert, abd wall)
 Nuchal Fold:           Appears normal         Cord Vessels:           Appears normal (3
                                                                       vessel cord)
 Face:                  Appears normal         Kidneys:                Appear normal
                        (orbits and profile)
 Lips:                  Appears normal         Bladder:                Appears normal
 Heart:                 Appears normal         Spine:                  Appears normal
                        (4CH, axis, and
                        situs)
 RVOT:                  Appears normal         Upper Extremities:      Appears normal
 LVOT:                  Appears normal         Lower Extremities:      Appears normal
Cervix Uterus Adnexa

 Cervix
 Length:           4.05  cm.

 Right Ovary
 Size(cm)     4.14   x   2.03   x  2.27      Vol(ml):
 Within normal limits.

 Left Ovary
 Size(cm)     3.74   x   2.76   x  2.7       Vol(ml):
 Within normal limits.
Impression

 G10 P7. Advanced maternal age. Patient had opted not to
 screen for fetal aneuploidies.
 Obstetric history is significant for 7 term vaginal deliveries.
 She reports no chronic medical conditions.
 We performed a fetal anatomy scan. No markers of
 aneuploidies or fetal structural defects are seen. Fetal
 biometry is consistent with her previously-established dates.
 Amniotic fluid is normal and good fetal activity is seen.
 Patient understands the limitations of ultrasound in detecting
 fetal anomalies, and opted not to screen for aneuploidies or
 amniocentesis.
Recommendations

 -Fetal growth assessment at 32 weeks.
 -Weekly BPP or NST from 36 weeks' gestation till delivery.
                 [place], Lionel Ujex

## 2024-04-28 ENCOUNTER — Ambulatory Visit: Payer: Self-pay

## 2024-04-28 VITALS — BP 132/80 | HR 68 | Wt 171.4 lb

## 2024-04-28 DIAGNOSIS — Z01419 Encounter for gynecological examination (general) (routine) without abnormal findings: Secondary | ICD-10-CM

## 2024-04-28 DIAGNOSIS — Z309 Encounter for contraceptive management, unspecified: Secondary | ICD-10-CM

## 2024-04-28 DIAGNOSIS — Z131 Encounter for screening for diabetes mellitus: Secondary | ICD-10-CM

## 2024-04-28 DIAGNOSIS — Z113 Encounter for screening for infections with a predominantly sexual mode of transmission: Secondary | ICD-10-CM

## 2024-04-28 DIAGNOSIS — Z1231 Encounter for screening mammogram for malignant neoplasm of breast: Secondary | ICD-10-CM

## 2024-04-28 LAB — HM HIV SCREENING LAB: HM HIV Screening: NEGATIVE

## 2024-04-28 LAB — WET PREP FOR TRICH, YEAST, CLUE
Clue Cell Exam: NEGATIVE
Trichomonas Exam: NEGATIVE
Yeast Exam: NEGATIVE
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
## 2024-04-28 NOTE — Progress Notes (Signed)
 Pt is here for annual visit. Wet Prep results reviewed with pt and requires no treatment. Condoms declined. Opportunity given to Patient to ask questions for any clarifications, questions answered. Wilkie Drought, RN.

## 2024-04-29 ENCOUNTER — Ambulatory Visit: Payer: Self-pay

## 2024-04-29 LAB — HGB A1C W/O EAG: Hgb A1c MFr Bld: 5.6 % (ref 4.8–5.6)

## 2024-04-29 NOTE — Progress Notes (Signed)
Normal diabetes screening
